# Patient Record
Sex: Female | Born: 1970 | Race: White | Hispanic: No | Marital: Married | Smoking: Never smoker
Health system: Southern US, Community
[De-identification: ages and names within clinical notes are randomized; demographics above are authoritative.]

## PROBLEM LIST (undated history)

## (undated) DIAGNOSIS — K222 Esophageal obstruction: Secondary | ICD-10-CM

## (undated) DIAGNOSIS — H9190 Unspecified hearing loss, unspecified ear: Secondary | ICD-10-CM

## (undated) DIAGNOSIS — E079 Disorder of thyroid, unspecified: Secondary | ICD-10-CM

## (undated) DIAGNOSIS — F419 Anxiety disorder, unspecified: Secondary | ICD-10-CM

## (undated) DIAGNOSIS — S62101A Fracture of unspecified carpal bone, right wrist, initial encounter for closed fracture: Secondary | ICD-10-CM

## (undated) DIAGNOSIS — K219 Gastro-esophageal reflux disease without esophagitis: Secondary | ICD-10-CM

## (undated) DIAGNOSIS — K317 Polyp of stomach and duodenum: Secondary | ICD-10-CM

## (undated) HISTORY — DX: Anxiety disorder, unspecified: F41.9

## (undated) HISTORY — PX: WRIST ARTHROSCOPY: SUR100

## (undated) HISTORY — DX: Fracture of unspecified carpal bone, right wrist, initial encounter for closed fracture: S62.101A

## (undated) HISTORY — DX: Polyp of stomach and duodenum: K31.7

## (undated) HISTORY — DX: Esophageal obstruction: K22.2

## (undated) HISTORY — PX: CERVIX LESION DESTRUCTION: SHX591

## (undated) HISTORY — DX: Unspecified hearing loss, unspecified ear: H91.90

## (undated) HISTORY — DX: Disorder of thyroid, unspecified: E07.9

## (undated) HISTORY — DX: Gastro-esophageal reflux disease without esophagitis: K21.9

---

## 1999-02-19 ENCOUNTER — Other Ambulatory Visit: Admission: RE | Admit: 1999-02-19 | Discharge: 1999-02-19 | Payer: Self-pay | Admitting: Gynecology

## 2000-03-11 ENCOUNTER — Other Ambulatory Visit: Admission: RE | Admit: 2000-03-11 | Discharge: 2000-03-11 | Payer: Self-pay | Admitting: Gynecology

## 2001-03-17 ENCOUNTER — Other Ambulatory Visit: Admission: RE | Admit: 2001-03-17 | Discharge: 2001-03-17 | Payer: Self-pay | Admitting: Gynecology

## 2002-03-20 ENCOUNTER — Other Ambulatory Visit: Admission: RE | Admit: 2002-03-20 | Discharge: 2002-03-20 | Payer: Self-pay | Admitting: Gynecology

## 2002-03-30 ENCOUNTER — Other Ambulatory Visit: Admission: RE | Admit: 2002-03-30 | Discharge: 2002-03-30 | Payer: Self-pay | Admitting: Gynecology

## 2002-06-21 ENCOUNTER — Other Ambulatory Visit: Admission: RE | Admit: 2002-06-21 | Discharge: 2002-06-21 | Payer: Self-pay | Admitting: Gynecology

## 2003-01-01 ENCOUNTER — Other Ambulatory Visit: Admission: RE | Admit: 2003-01-01 | Discharge: 2003-01-01 | Payer: Self-pay | Admitting: Gynecology

## 2003-08-16 ENCOUNTER — Other Ambulatory Visit: Admission: RE | Admit: 2003-08-16 | Discharge: 2003-08-16 | Payer: Self-pay | Admitting: Gynecology

## 2003-12-03 ENCOUNTER — Other Ambulatory Visit: Admission: RE | Admit: 2003-12-03 | Discharge: 2003-12-03 | Payer: Self-pay | Admitting: Gynecology

## 2004-05-19 ENCOUNTER — Other Ambulatory Visit: Admission: RE | Admit: 2004-05-19 | Discharge: 2004-05-19 | Payer: Self-pay | Admitting: Gynecology

## 2004-06-01 ENCOUNTER — Emergency Department (HOSPITAL_COMMUNITY): Admission: EM | Admit: 2004-06-01 | Discharge: 2004-06-01 | Payer: Self-pay | Admitting: *Deleted

## 2004-06-05 ENCOUNTER — Ambulatory Visit (HOSPITAL_BASED_OUTPATIENT_CLINIC_OR_DEPARTMENT_OTHER): Admission: RE | Admit: 2004-06-05 | Discharge: 2004-06-05 | Payer: Self-pay | Admitting: Orthopedic Surgery

## 2004-11-25 ENCOUNTER — Other Ambulatory Visit: Admission: RE | Admit: 2004-11-25 | Discharge: 2004-11-25 | Payer: Self-pay | Admitting: Gynecology

## 2005-05-13 ENCOUNTER — Other Ambulatory Visit: Admission: RE | Admit: 2005-05-13 | Discharge: 2005-05-13 | Payer: Self-pay | Admitting: Gynecology

## 2005-12-03 ENCOUNTER — Other Ambulatory Visit: Admission: RE | Admit: 2005-12-03 | Discharge: 2005-12-03 | Payer: Self-pay | Admitting: Gynecology

## 2006-02-11 IMAGING — CR DG WRIST COMPLETE 3+V*R*
4 series · 4 of 4 positions shown · non-contrast
Comparison: none

CLINICAL DATA: Fell rollerblading, pain.
 RIGHT WRIST COMPLETE:
 There is a comminuted fracture of the distal right radius present.   The fracture is impacted and associated with mild dorsal angulation of the distal shaft.   The fracture does extend into the radiocarpal joint.  No dislocation seen associated with this.

[view not recorded (1 of 4)]
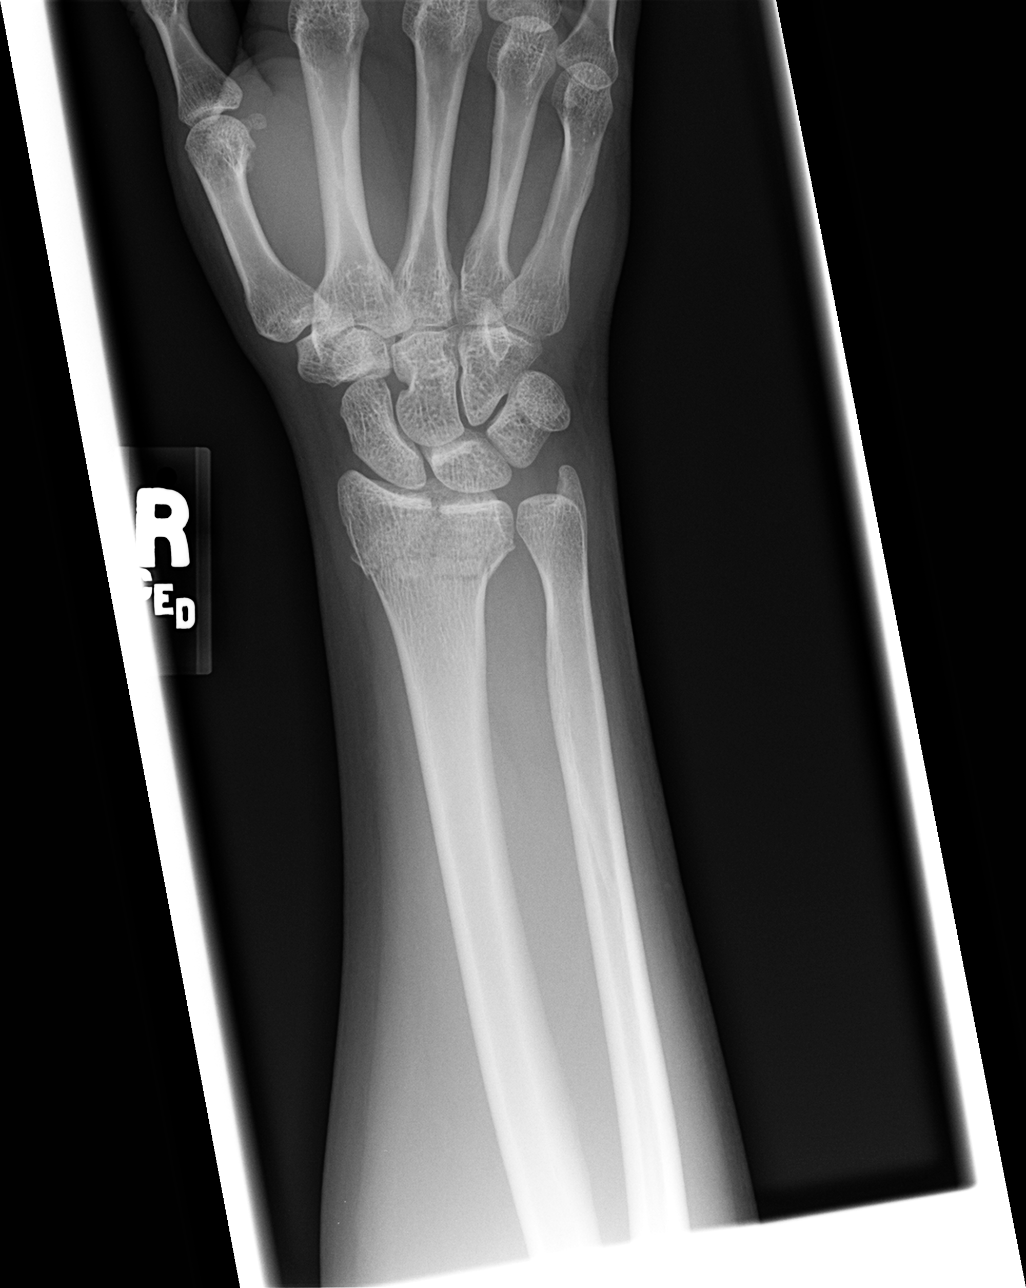

[view not recorded (2 of 4)]
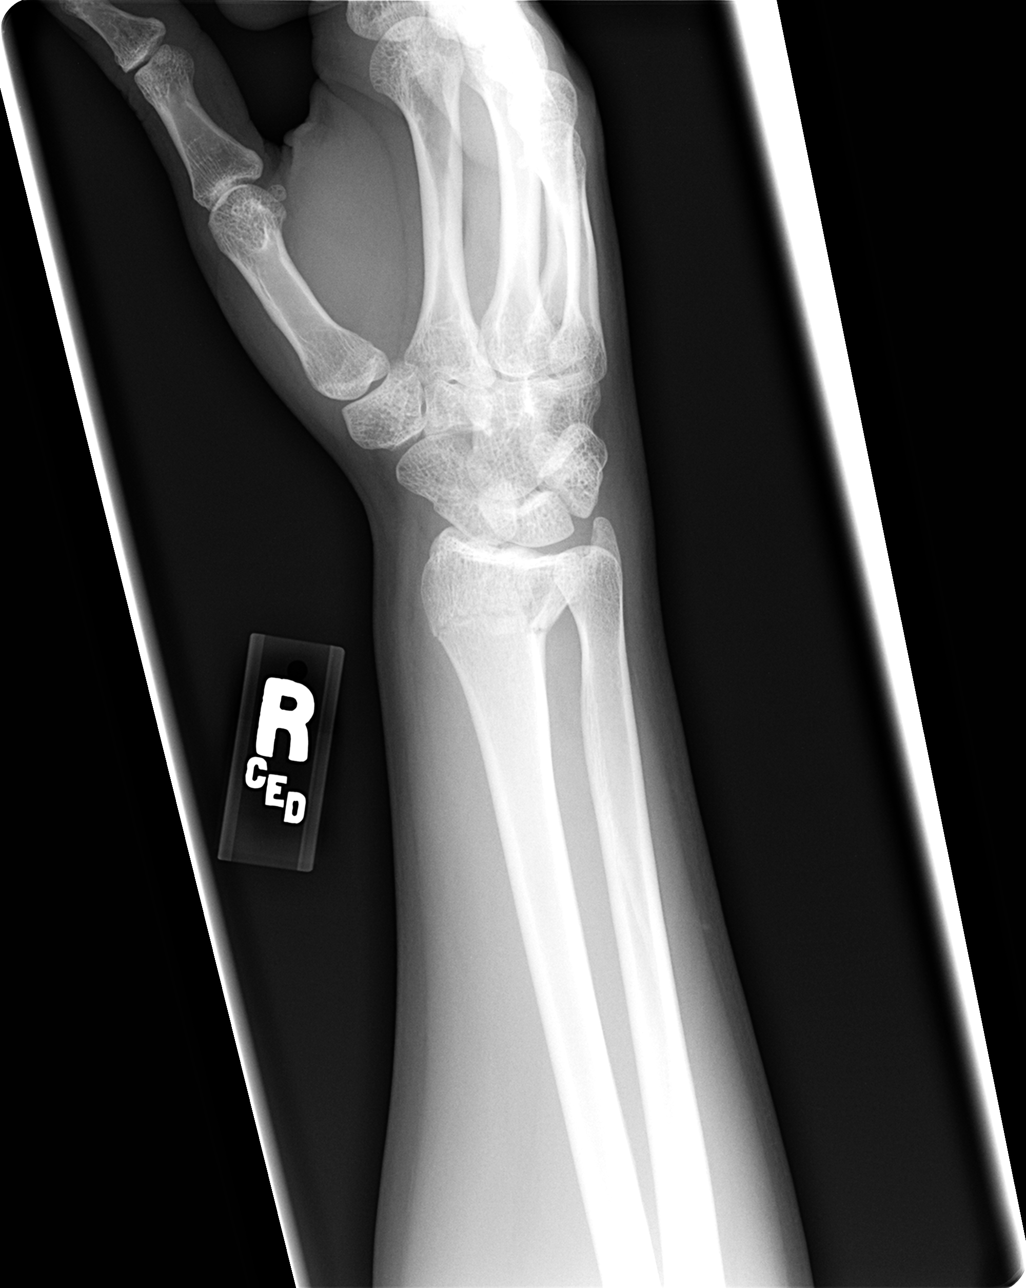

[view not recorded (3 of 4)]
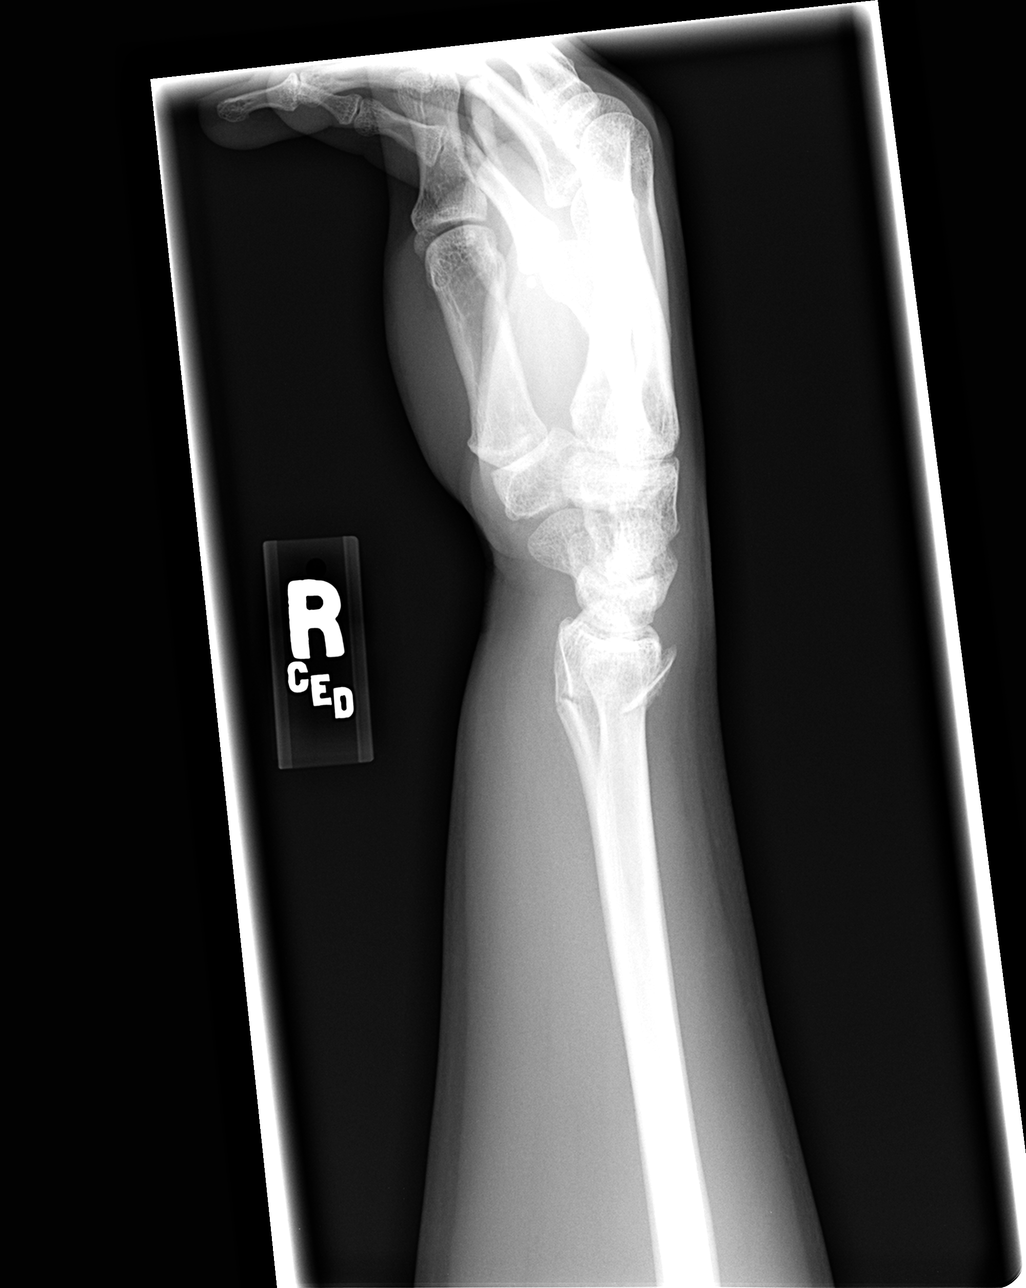

[view not recorded (4 of 4)]
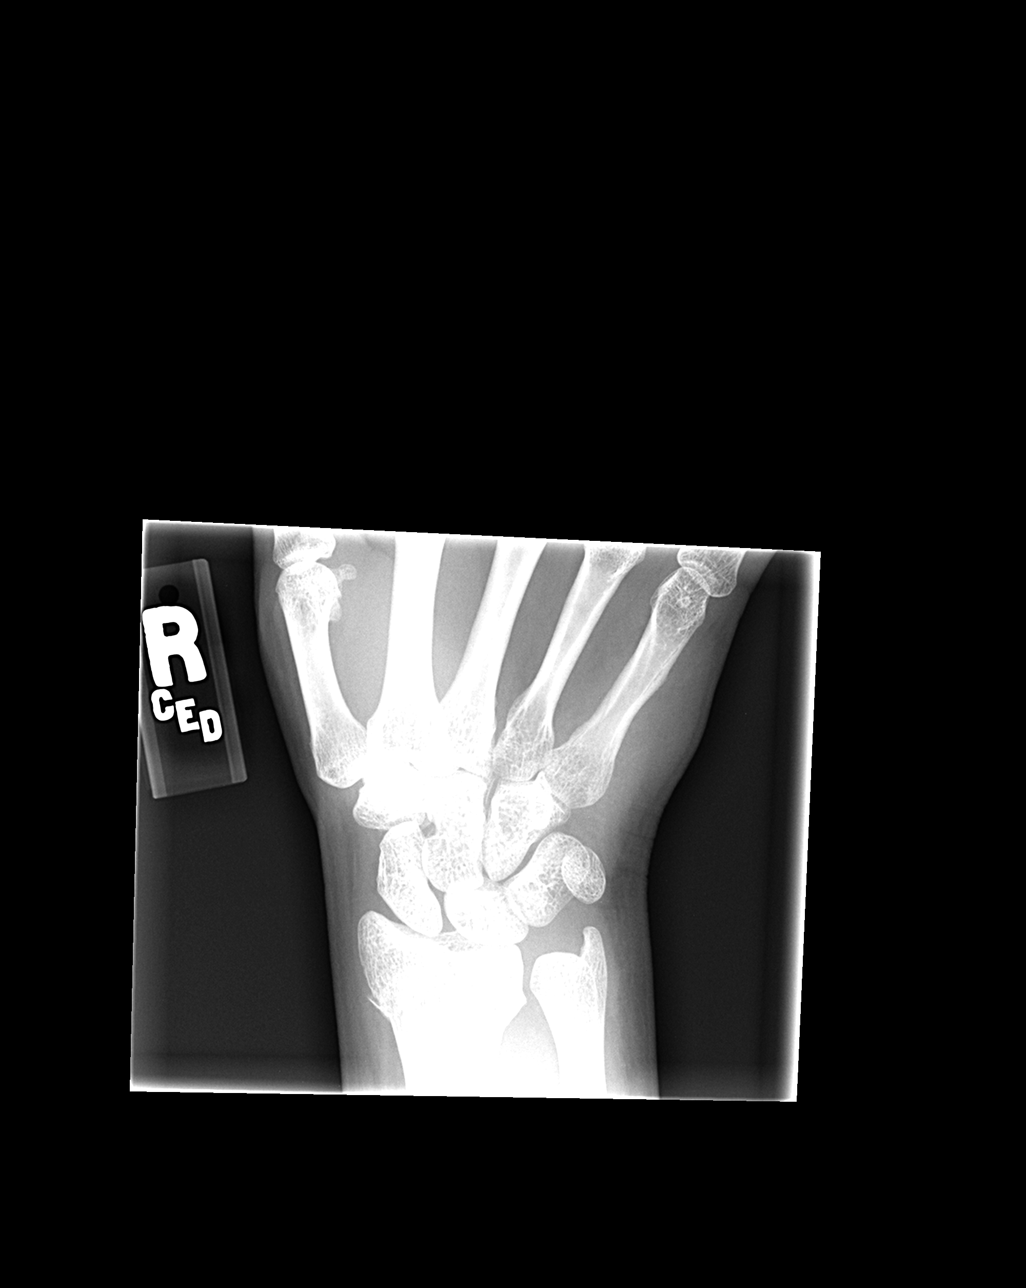

[4 of 4 positions shown; findings below may reference images not displayed]

IMPRESSION: Comminuted fracture distal right radius as discussed above.

## 2008-02-07 ENCOUNTER — Inpatient Hospital Stay (HOSPITAL_COMMUNITY): Admission: AD | Admit: 2008-02-07 | Discharge: 2008-02-07 | Payer: Self-pay | Admitting: Obstetrics and Gynecology

## 2008-02-08 ENCOUNTER — Inpatient Hospital Stay (HOSPITAL_COMMUNITY): Admission: AD | Admit: 2008-02-08 | Discharge: 2008-02-12 | Payer: Self-pay | Admitting: Obstetrics and Gynecology

## 2008-02-10 ENCOUNTER — Encounter (INDEPENDENT_AMBULATORY_CARE_PROVIDER_SITE_OTHER): Payer: Self-pay | Admitting: Obstetrics and Gynecology

## 2010-02-09 NOTE — L&D Delivery Note (Signed)
Delivery Note Pt progressed to complete dilation and pushed twice.  At 3:11 PM a healthy female was delivered via Vaginal, Spontaneous Delivery (Presentation: Left Occiput Anterior).  APGAR: 8, 9; weight 7 lb 5.6 oz (3334 g).   Placenta status: Intact, Spontaneous.   Anesthesia: Epidural  Episiotomy: None Lacerations: 1st degree Suture Repair: 3.0 vicryl Est. Blood Loss (mL): 350cc  Mom to postpartum.  Baby to nursery-stable.  Oliver Pila 10/16/2010, 3:30 PM

## 2010-04-04 LAB — ANTIBODY SCREEN: Antibody Screen: NEGATIVE

## 2010-04-04 LAB — VARICELLA ZOSTER ANTIBODY, IGG: Varicella: UNDETERMINED

## 2010-04-04 LAB — HEPATITIS B SURFACE ANTIGEN: Hepatitis B Surface Ag: NEGATIVE

## 2010-04-04 LAB — HIV ANTIBODY (ROUTINE TESTING W REFLEX): HIV: NONREACTIVE

## 2010-04-04 LAB — RUBELLA ANTIBODY, IGM: Rubella: UNDETERMINED

## 2010-05-26 LAB — CBC
MCHC: 34.1 g/dL (ref 30.0–36.0)
MCV: 90.5 fL (ref 78.0–100.0)
Platelets: 94 10*3/uL — ABNORMAL LOW (ref 150–400)
RBC: 3.8 MIL/uL — ABNORMAL LOW (ref 3.87–5.11)
RBC: 4.18 MIL/uL (ref 3.87–5.11)
WBC: 17.5 10*3/uL — ABNORMAL HIGH (ref 4.0–10.5)

## 2010-06-24 NOTE — H&P (Signed)
NAMEDEVANSHI, CALIFF NO.:  [id number]   MEDICAL RECORD NO.:  [id number]          PATIENT TYPE:  INP   LOCATION:  NA                            FACILITY:  [facility]   PHYSICIAN:  Sherron Monday, MD        DATE OF BIRTH:  [birth year]   DATE OF ADMISSION:  02/08/2008  DATE OF DISCHARGE:                              HISTORY & PHYSICAL   ADMISSION DIAGNOSES:  Intrauterine pregnancy at 40+ weeks, pregnancy-  induced hypertension, unfavorable cervix.   PROCEDURE PLANNED:  Induction of labor with Cervidil and Pitocin.   HISTORY OF PRESENT ILLNESS:  A 40 year old Caucasian female, G1, P0 at  40 plus 3, being admitted for induction of labor secondary to post term,  as well as pregnancy-induced hypertension, with an unfavorable cervix.  Her prenatal care has been largely uncomplicated, except that she is  advanced maternal age and having some issue with elevated LFT on routine  labs checked by her insurance company.  On the day of admission, she  states she has had good fetal movement, no loss of fluid, no vaginal  bleeding and occasional contractions.   PAST MEDICAL HISTORY:  Not significant.   PAST SURGICAL HISTORY:  A broken wrist.   PAST OBSTETRIC AND GYNECOLOGIC HISTORY:  G1 present pregnancy with an  [facility] of February 03, 2008, confirmed by LMP and early ultrasound.  She  has a history of an abnormal Pap smear with cryotherapy.  No sexually  transmitted diseases.   MEDICATIONS:  Prenatal vitamins.   ALLERGIES:  No known drug allergies.   SOCIAL HISTORY:  The patient denies alcohol, tobacco or drug use and is  married.   FAMILY HISTORY:  Significant for kidney and lung cancer in the maternal  grandmother.  Maternal grandfather with lung cancer.   PHYSICAL EXAMINATION:  VITAL SIGNS:  Afebrile.  Vital signs stable.  GENERAL:  In no apparent distress.  CARDIOVASCULAR:  Regular rate and rhythm.  LUNGS:  Clear to auscultation bilaterally.  ABDOMEN:  Soft, nontender,  nondistended.  Fundus is nontender.  EXTREMITIES:  Symmetric and nontender.  PELVIC:  Vaginal exam is 1 cm dilated, 70% effaced and -2 station and  vertex.   PRENATAL LABORATORIES:  Hemoglobin 14.0, platelets 238,000.  B positive.  Antibody screen negative.  Urinalysis negative.  Glucola 104.  Gonorrhea  negative, chlamydia negative.  RPR nonreactive.  Rubella equivocal.  Group B strep negative.  Cystic fibrosis screen negative.  Hepatitis B  surface antigen negative.  HIV negative.  AFP negative.  First trimester  screen within normal limits.  The day prior to her admission she was  evaluated for her pregnancy-induced hypertension and found to have  normal PIH labs.  Her blood pressures were closely monitored.  She had  an ultrasound at 11 and 5 weeks revealing a vanishing twin, normal  nuchal thickness.  Anatomy scan revealed normal anatomy, posterior  placenta and a female infant.  Group B strep negative.   IMPRESSION AND PLAN:  A 40 year old G1 for induction of labor with  Cervidil and Pitocin.  Discussed with the patient risks, benefits and  alternatives of induction of labor, given her elevated blood pressures.  She voiced an understanding to all this and wishes to proceed.  We will  admit her on the evening of February 08, 2008 and give her Cervidil on  that evening.  In the morning, will attempt to rupture her membranes and  give her Pitocin to augment her labor.  She voices understanding to this  plan.  We will also tale pregnancy induced hypertension labs at her  admission to evaluate where we are with that and monitor her blood  pressures closely.      Sherron Monday, MD  Electronically Signed     JB/MEDQ  D:  02/08/2008  T:  02/08/2008  Job:  [id number]

## 2010-06-24 NOTE — Discharge Summary (Signed)
[name], Dean NO.:  [id number]   MEDICAL RECORD NO.:  [id number]          PATIENT TYPE:  INP   LOCATION:  9132                          FACILITY:  [facility]   PHYSICIAN:  Huel Cote, M.D. DATE OF BIRTH:  14-Oct-1970   DATE OF ADMISSION:  02/08/2008  DATE OF DISCHARGE:  02/12/2008                               DISCHARGE SUMMARY   DISCHARGE DIAGNOSES:  1. Term pregnancy at 40 plus weeks, delivered.  2. Status post normal spontaneous vaginal delivery.  3. Intrapartum fever treated with antibiotics.  4. Mildly elevated blood pressures, postpartum.   DISCHARGE MEDICATIONS:  1. Motrin 600 mg p.o. every 6 hours.  2. Percocet 1-2 tablets p.o. every 4 hours p.r.n.   DISCHARGE FOLLOWUP:  The patient is to follow up in the office in 6  weeks with Dr. Ellyn Hack.   HOSPITAL COURSE:  The patient is a 40 year old G1, P0 admitted to 40  plus weeks' gestation for induction of labor.  Given postterm status  with some elevated blood pressures and negative preeclampsia workup.  Pregnancy then complicated by advanced maternal age and she had a  vanishing twin early on.  There were some elevated liver enzymes on her  lab exam, which normalized.  On admission, her blood pressures were  mildly elevated with diastolics in the 80s to 90s; however, all  preeclamptic labs were normal, and she was negative for protein in the  urine.   PAST MEDICAL HISTORY:  None.   PAST SURGICAL HISTORY:  Broken wrist.   PAST OBSTETRICAL HISTORY:  Current pregnancy only, was on initial twin  pregnancy with vanishing twin on the first trimester.   PAST GYN HISTORY:  None significant except for a history of cryotherapy  from an abnormal Pap smear, normal since that time.   MEDICATIONS:  Prenatal vitamins.   ALLERGIES:  None.   On admission, the patient's cervix was 1, 70, and -2 station.  Prenatal  labs are as follows, B positive, antibody negative, RPR nonreactive,  rubella equivocal,  group B strep negative, GC negative, Chlamydia  negative, hep B and surface antigen negative, HIV negative, and first  trimester screen normal.  She was admitted and given Cervidil and  Pitocin to ripen her.  The morning after admission, she was found to be  1+, 70, and -2 station, and had some contractions overnight.  She  progressed slowly throughout the day and late in the evening reached  active labor, received an epidural.  Before she delivered, she did spike  of significant temperature to 102.  Therefore, was placed on gentamicin  and ampicillin.  She eventually progressed to complete dilation, pushed  well, and had a normal spontaneous vaginal delivery of a vigorous female  infant, Apgars were 8 and 9, and weight was 7 pounds even.  Placenta  delivered intact.  Perineal laceration, second-degree in nature were  repaired with 3-0 Vicryl.  She remained on her antibiotics at delivery  and defervesced quickly thereafter.  She did have a small skin tag,  which was removed at the time of delivery.  She was admitted for routine  postpartum care.  On postpartum day #2, she was doing quite well except  for some cramping, her pain was well controlled, and she was ready for  discharge home.  She was discharged home with followup in the office in  6 weeks for her routine postpartum exam and given prescriptions for  Motrin and Percocet.      Huel Cote, M.D.  Electronically Signed     KR/MEDQ  D:  02/12/2008  T:  02/12/2008  Job:  [id number]

## 2010-06-27 NOTE — Op Note (Signed)
[name], [name]                ACCOUNT NO.:  [id number]   MEDICAL RECORD NO.:  [id number]          PATIENT TYPE:  AMB   LOCATION:  DSC                          FACILITY:  [facility]   PHYSICIAN:  Katy Fitch. Sypher Montez Hageman., M.D.DATE OF BIRTH:  Aug 05, 1970   DATE OF PROCEDURE:  06/05/2004  DATE OF DISCHARGE:                                 OPERATIVE REPORT   REFERRING PHYSICIAN:  Sheppard Penton. Stacie Acres, M.D.   PREOPERATIVE DIAGNOSIS:  Comminuted articular fracture right distal radius.   POSTOPERATIVE DIAGNOSIS:  Comminuted articular fracture right distal radius.   OPERATION:  Open reduction and internal fixation of right distal radial  comminuted articular fracture utilizing a 7 peg DVR plate system.   OPERATING SURGEON:  Katy Fitch. Sypher, M.D.   ASSISTANT:  Annye Rusk, PA-C.   ANESTHESIA:  general by LMA.   SUPERVISING ANESTHESIOLOGIST:  Quita Skye. Krista Blue, M.D.   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  While rollerblading on June 01, 2004, she fell onto  her outstretched right hand sustaining a comminuted articular fracture of  her right radius. She was seen in the emergency room by Evlyn Kanner, PA-C  where x-rays were obtained revealing a comminuted articular fracture of the  right radius.   She was placed in a well-padded sugar-tong splint and referred to our office  for follow-up on June 02, 2004.   At that time she was noted have minimal swelling. Her neurovascular exam was  intact. She had normal sensibility in the median, ulnar and radial  distribution and good capillary refill. Her films were studied. We  recommended proceeding with open reduction and internal fixation with  application of a DVR plate system.   She is brought to the operating room at this time.   PROCEDURE:  Stacey Dean is brought to the operating room, placed in supine  position on the operating table.   Following the induction of general  anesthesia by LMA technique, the right  arm was prepped with Betadine soap solution, sterilely draped. Pneumatic  tourniquet was applied proximal brachium.   Following exsanguination of the right arm with an Esmarch bandage, the  arterial tourniquet on the proximal brachium is inflated to 120 mmHg.   Procedure commenced with a standard DVR volar incision paralleling the  flexor carpi radialis tendon sheath. The fascia was released from the floor  of the flexor carpi radialis followed by retraction of the flexor pollicis  longus and ulnar direction and the radial artery in a radial direction.   Stacey Dean had a atypical brachial radialis muscle with both the muscle belly  and a palmar insertion of the tendon.   This tendon was elevated and the brachial radialis retracted radially.   The pronator quadratus was elevated and the fracture site identified.   The fracture was then molded with three-point molding directly on the volar  aspect of the cortex as well as with transcutaneous molding dorsally.   Anatomic reduction of the displaced articular fracture fragments was  achieved.   A 7-peg DVR plate was carefully placed in the  proper position to allow  anatomic reduction of her fracture.   A total of five smooth pegs and two threaded pegs were applied with a  threaded peg into the radial styloid and a threaded peg into the most ulnar  aspect of the lunate facette to control these fragments securely.   Care was taken to control pain and screw length with a C-arm fluoroscope as  well as careful measurements during drilling.   An anatomic reduction of the radius was achieved.   The plate was secured to the cortex with the standard cortical screws.   The wound was then carefully lavaged with sterile saline followed by repair  of the pronator quadratus over the plate with mattress suture of 0 Vicryl  followed by repair the skin with interrupted suture of 4-0 Vicryl and  intradermal  3-0 Prolene segmental sutures.   Stacey Dean was placed a compressive dressing with a sugar-tong splint  maintaining the forearm in 45 degrees of supination.   There were no apparent complications.. She tolerated surgery and anesthesia  well.   She will be discharged with prescriptions for Dilaudid 2 mg one to two  tablets p.o. q.4-6h. p.r.n. pain, 20 tablets without refill. Also Motrin 6  mg one p.o. q.6h. p.r.n. pain, 30 tablets of one refill and Keflex 500 mg  one p.o. q.8h. x4 days as prophylactic antibiotic.   She will return to our office for follow-up in one week or sooner if any  problems. She is encouraged to range her fingers, thumb and shoulder.      RVS/MEDQ  D:  06/05/2004  T:  06/05/2004  Job:  [id number]   cc:   Sheppard Penton. Stacie Acres, M.D.  [address]  [place] [address]  Fax: [phone]

## 2010-09-24 LAB — STREP B DNA PROBE: GBS: NEGATIVE

## 2010-10-09 ENCOUNTER — Telehealth (HOSPITAL_COMMUNITY): Payer: Self-pay | Admitting: *Deleted

## 2010-10-09 ENCOUNTER — Encounter (HOSPITAL_COMMUNITY): Payer: Self-pay | Admitting: *Deleted

## 2010-10-09 NOTE — Telephone Encounter (Signed)
Preadmission screen  

## 2010-10-10 ENCOUNTER — Encounter (HOSPITAL_COMMUNITY): Payer: Self-pay | Admitting: *Deleted

## 2010-10-10 ENCOUNTER — Telehealth (HOSPITAL_COMMUNITY): Payer: Self-pay | Admitting: *Deleted

## 2010-10-10 NOTE — Telephone Encounter (Signed)
Preadmission screen  

## 2010-10-16 ENCOUNTER — Encounter (HOSPITAL_COMMUNITY): Payer: Self-pay | Admitting: Anesthesiology

## 2010-10-16 ENCOUNTER — Inpatient Hospital Stay (HOSPITAL_COMMUNITY)
Admission: RE | Admit: 2010-10-16 | Discharge: 2010-10-17 | DRG: 775 | Disposition: A | Discharge status: Home / Self Care | Payer: 59 | Source: Ambulatory Visit | Attending: Obstetrics and Gynecology | Admitting: Obstetrics and Gynecology

## 2010-10-16 ENCOUNTER — Other Ambulatory Visit: Payer: Self-pay | Admitting: Obstetrics and Gynecology

## 2010-10-16 ENCOUNTER — Inpatient Hospital Stay (HOSPITAL_COMMUNITY): Payer: 59 | Admitting: Anesthesiology

## 2010-10-16 ENCOUNTER — Encounter (HOSPITAL_COMMUNITY): Payer: Self-pay

## 2010-10-16 DIAGNOSIS — O09529 Supervision of elderly multigravida, unspecified trimester: Secondary | ICD-10-CM | POA: Diagnosis present

## 2010-10-16 LAB — CBC
HCT: 41.7 % (ref 36.0–46.0)
MCHC: 33.6 g/dL (ref 30.0–36.0)
RDW: 13.1 % (ref 11.5–15.5)

## 2010-10-16 LAB — COMPREHENSIVE METABOLIC PANEL
ALT: 11 U/L (ref 0–35)
BUN: 11 mg/dL (ref 6–23)
Calcium: 10.2 mg/dL (ref 8.4–10.5)
GFR calc Af Amer: >60 mL/min (ref >60)
Glucose, Bld: 72 mg/dL (ref 70–99)
Sodium: 135 mEq/L (ref 135–145)
Total Protein: 6.5 g/dL (ref 6.0–8.3)

## 2010-10-16 LAB — URINALYSIS, DIPSTICK ONLY
Protein, ur: NEGATIVE mg/dL
Urobilinogen, UA: 0.2 mg/dL (ref 0.0–1.0)

## 2010-10-16 MED ORDER — SIMETHICONE 80 MG PO CHEW: 80.0000 mg | CHEWABLE_TABLET | ORAL | Status: DC | PRN

## 2010-10-16 MED ORDER — OXYCODONE-ACETAMINOPHEN 5-325 MG PO TABS
1.0000 | ORAL_TABLET | ORAL | Status: DC | PRN
  Administered 2010-10-16: 1 via ORAL
  Filled 2010-10-16: qty 1

## 2010-10-16 MED ORDER — IBUPROFEN 600 MG PO TABS
600.0000 mg | ORAL_TABLET | Freq: Four times a day (QID) | ORAL | Status: DC
  Administered 2010-10-16 – 2010-10-17 (×4): 600 mg via ORAL
  Filled 2010-10-16 (×4): qty 1

## 2010-10-16 MED ORDER — FLEET ENEMA 7-19 GM/118ML RE ENEM: 1.0000 | ENEMA | RECTAL | Status: DC | PRN

## 2010-10-16 MED ORDER — BENZOCAINE-MENTHOL 20-0.5 % EX AERO
1.0000 "application " | INHALATION_SPRAY | CUTANEOUS | Status: DC | PRN
  Administered 2010-10-16: 1 via TOPICAL

## 2010-10-16 MED ORDER — EPHEDRINE 5 MG/ML INJ
10.0000 mg | INTRAVENOUS | Status: DC | PRN
  Filled 2010-10-16 (×2): qty 4

## 2010-10-16 MED ORDER — LIDOCAINE HCL (PF) 1 % IJ SOLN
30.0000 mL | INTRAMUSCULAR | Status: DC | PRN
  Filled 2010-10-16 (×2): qty 30

## 2010-10-16 MED ORDER — PHENYLEPHRINE 40 MCG/ML (10ML) SYRINGE FOR IV PUSH (FOR BLOOD PRESSURE SUPPORT)
80.0000 ug | PREFILLED_SYRINGE | INTRAVENOUS | Status: DC | PRN
  Filled 2010-10-16 (×2): qty 5

## 2010-10-16 MED ORDER — SODIUM BICARBONATE 8.4 % IV SOLN
INTRAVENOUS | Status: DC | PRN
  Administered 2010-10-16: 5 mL via EPIDURAL

## 2010-10-16 MED ORDER — OXYTOCIN 20 UNITS IN LACTATED RINGERS INFUSION - SIMPLE
1.0000 m[IU]/min | INTRAVENOUS | Status: DC
  Administered 2010-10-16: 2 m[IU]/min via INTRAVENOUS
  Filled 2010-10-16: qty 1000

## 2010-10-16 MED ORDER — ACETAMINOPHEN 325 MG PO TABS: 650.0000 mg | ORAL_TABLET | ORAL | Status: DC | PRN

## 2010-10-16 MED ORDER — ZOLPIDEM TARTRATE 5 MG PO TABS: 5.0000 mg | ORAL_TABLET | Freq: Every evening | ORAL | Status: DC | PRN

## 2010-10-16 MED ORDER — SENNOSIDES-DOCUSATE SODIUM 8.6-50 MG PO TABS
2.0000 | ORAL_TABLET | Freq: Every day | ORAL | Status: DC
  Administered 2010-10-16: 2 via ORAL

## 2010-10-16 MED ORDER — DIBUCAINE 1 % RE OINT
1.0000 "application " | TOPICAL_OINTMENT | RECTAL | Status: DC | PRN
  Administered 2010-10-16: 1 via RECTAL
  Filled 2010-10-16: qty 28

## 2010-10-16 MED ORDER — LACTATED RINGERS IV SOLN
500.0000 mL | Freq: Once | INTRAVENOUS | Status: AC
  Administered 2010-10-16: 1000 mL via INTRAVENOUS

## 2010-10-16 MED ORDER — OXYTOCIN BOLUS FROM INFUSION
500.0000 mL | Freq: Once | INTRAVENOUS | Status: AC
  Administered 2010-10-16: 500 mL via INTRAVENOUS
  Filled 2010-10-16: qty 500

## 2010-10-16 MED ORDER — ONDANSETRON HCL 4 MG/2ML IJ SOLN: 4.0000 mg | INTRAMUSCULAR | Status: DC | PRN

## 2010-10-16 MED ORDER — ONDANSETRON HCL 4 MG PO TABS: 4.0000 mg | ORAL_TABLET | ORAL | Status: DC | PRN

## 2010-10-16 MED ORDER — LACTATED RINGERS IV SOLN: 500.0000 mL | INTRAVENOUS | Status: DC | PRN

## 2010-10-16 MED ORDER — DIPHENHYDRAMINE HCL 50 MG/ML IJ SOLN: 12.5000 mg | INTRAMUSCULAR | Status: DC | PRN

## 2010-10-16 MED ORDER — FENTANYL 2.5 MCG/ML BUPIVACAINE 1/10 % EPIDURAL INFUSION (WH - ANES)
14.0000 mL/h | INTRAMUSCULAR | Status: DC
  Administered 2010-10-16 (×2): 14 mL/h via EPIDURAL
  Filled 2010-10-16 (×2): qty 60

## 2010-10-16 MED ORDER — ONDANSETRON HCL 4 MG/2ML IJ SOLN: 4.0000 mg | Freq: Four times a day (QID) | INTRAMUSCULAR | Status: DC | PRN

## 2010-10-16 MED ORDER — BENZOCAINE-MENTHOL 20-0.5 % EX AERO
INHALATION_SPRAY | CUTANEOUS | Status: AC
  Administered 2010-10-16: 21:00:00
  Filled 2010-10-16: qty 56

## 2010-10-16 MED ORDER — OXYTOCIN 20 UNITS IN LACTATED RINGERS INFUSION - SIMPLE: 125.0000 mL/h | Freq: Once | INTRAVENOUS | Status: DC

## 2010-10-16 MED ORDER — DIPHENHYDRAMINE HCL 25 MG PO CAPS: 25.0000 mg | ORAL_CAPSULE | Freq: Four times a day (QID) | ORAL | Status: DC | PRN

## 2010-10-16 MED ORDER — OXYCODONE-ACETAMINOPHEN 5-325 MG PO TABS: 2.0000 | ORAL_TABLET | ORAL | Status: DC | PRN

## 2010-10-16 MED ORDER — CITRIC ACID-SODIUM CITRATE 334-500 MG/5ML PO SOLN: 30.0000 mL | ORAL | Status: DC | PRN

## 2010-10-16 MED ORDER — LACTATED RINGERS IV SOLN
INTRAVENOUS | Status: DC
  Administered 2010-10-16 (×2): 125 mL/h via INTRAVENOUS

## 2010-10-16 MED ORDER — WITCH HAZEL-GLYCERIN EX PADS
1.0000 "application " | MEDICATED_PAD | CUTANEOUS | Status: DC | PRN
  Administered 2010-10-16: 1 via TOPICAL

## 2010-10-16 MED ORDER — IBUPROFEN 600 MG PO TABS: 600.0000 mg | ORAL_TABLET | Freq: Four times a day (QID) | ORAL | Status: DC | PRN

## 2010-10-16 MED ORDER — LANOLIN HYDROUS EX OINT: TOPICAL_OINTMENT | CUTANEOUS | Status: DC | PRN

## 2010-10-16 MED ORDER — PHENYLEPHRINE 40 MCG/ML (10ML) SYRINGE FOR IV PUSH (FOR BLOOD PRESSURE SUPPORT)
80.0000 ug | PREFILLED_SYRINGE | INTRAVENOUS | Status: DC | PRN
  Filled 2010-10-16: qty 5

## 2010-10-16 MED ORDER — TETANUS-DIPHTH-ACELL PERTUSSIS 5-2.5-18.5 LF-MCG/0.5 IM SUSP
0.5000 mL | Freq: Once | INTRAMUSCULAR | Status: AC
  Administered 2010-10-17: 0.5 mL via INTRAMUSCULAR
  Filled 2010-10-16: qty 0.5

## 2010-10-16 MED ORDER — TERBUTALINE SULFATE 1 MG/ML IJ SOLN: 0.2500 mg | Freq: Once | INTRAMUSCULAR | Status: DC | PRN

## 2010-10-16 MED ORDER — EPHEDRINE 5 MG/ML INJ
10.0000 mg | INTRAVENOUS | Status: DC | PRN
  Filled 2010-10-16: qty 4

## 2010-10-16 MED ORDER — PRENATAL PLUS 27-1 MG PO TABS
1.0000 | ORAL_TABLET | Freq: Every day | ORAL | Status: DC
  Filled 2010-10-16: qty 1

## 2010-10-16 NOTE — Progress Notes (Signed)
Stacey Dean is a 40 y.o. G2P1001 at [redacted]w[redacted]d   Subjective: Pt feels well s/p epidural.  Objective: BP 133/74  Pulse 95  Temp(Src) 97.4 F (36.3 C) (Oral)  Resp 15  Ht 5\' 7"  (1.702 m)  Wt 86.637 kg (191 lb)  BMI 29.91 kg/m2  SpO2 97%      FHT:  FHR: 140 bpm, variability: moderate,  accelerations:  Present,  decelerations:  Absent UC:   regular, every 3 minutes SVE:   80/5-6/-1  Labs: Lab Results  Component Value Date   WBC 10.7* 10/16/2010   HGB 14.0 10/16/2010   HCT 41.7 10/16/2010   MCV 87.6 10/16/2010   PLT 168 10/16/2010    Assessment / Plan: Induction of labor due to favorable cervix,  progressing well on pitocin  Labor: Progressing normally Preeclampsia:  labs WNL, BP WNL with epidural Fetal Wellbeing:  Category I Pain Control:  Epidural Carolene Gitto W 10/16/2010, 12:59 PM

## 2010-10-16 NOTE — Anesthesia Procedure Notes (Signed)
Epidural Patient location during procedure: OB  Staffing Anesthesiologist: Nicolaus Andel EDWARD  Preanesthetic Checklist Completed: patient identified, site marked, surgical consent, pre-op evaluation, timeout performed, IV checked, risks and benefits discussed and monitors and equipment checked  Epidural Patient position: sitting Prep: site prepped and draped and DuraPrep Patient monitoring: continuous pulse ox and blood pressure Approach: midline Injection technique: LOR air  Needle:  Needle type: Tuohy  Needle gauge: 17 G Needle length: 9 cm Needle insertion depth: 5 cm cm Catheter type: closed end flexible Catheter size: 19 Gauge Catheter at skin depth: 10 cm Test dose: negative  Assessment Events: blood not aspirated, injection not painful, no injection resistance, negative IV test and no paresthesia  Additional Notes Dosing of Epidural: 1st dose, through needle...... ( mg expressed as equavilent  cc's medication  from .1%Bupiv / fentanyl syringe from L&D pump)...............  5mg Marcaine  2nd dose, through catheter after waiting 3 minutes.... epi 1:200K + Xylocaine 40 mg 3rd dose, through catheter, after waiting 3 minutes.....epi 1:200K + Xylocaine 60 mg ( 2% Xylo charted as a single dose in Epic Meds for ease of charting; actual dosing was fractionated as above, for saftey's sake)  As each dose occurred, patient was free of IV sx; and patient exhibited no evidence of SA injection.  Patient is more comfortable after epidural dosed. Please see RN's note for documentation of vital signs,and FHR which are stable.    

## 2010-10-16 NOTE — H&P (Signed)
Stacey Dean is a 40 y.o. female G2P1001 at 63 2/7 weeks (EDD 10/21/10 by 9 week [place]) presenting for induction of labor given term status and favorable cervix with some mildly elevated BP's to 130/90's over the last several prenatal visits.  No proteinuria, no PIH sx No other prenatal issues.  History OB History    Grav Para Term Preterm Abortions TAB SAB Ect Mult Living   2 1 1       1     NSVD 2010 7lbs  Past Medical History  Diagnosis Date  . Wrist fracture, right     hx   Past Surgical History  Procedure Date  . Cervix lesion destruction         (Cryo)  Family History: family history includes Cancer in her maternal grandfather and maternal grandmother; Heart disease in her father; and Stroke in her paternal grandfather. Social History:  reports that she has never smoked. She does not have any smokeless tobacco history on file. She reports that she does not drink alcohol or use illicit drugs.  ROS  Dilation: 2 Effacement (%): 50 Station: -2 Exam by:: Dr Senaida Ores AROM clear  Blood pressure 146/91, pulse 92, temperature 97.9 F (36.6 C), temperature source Oral, resp. rate 19, height 5\' 7"  (1.702 m), weight 86.637 kg (191 lb). Maternal Exam:  Abdomen: Patient reports no abdominal tenderness. Estimated fetal weight is 7.5 lbs.   Fetal presentation: vertex  Introitus: Normal vulva. Normal vagina.  Amniotic fluid character: clear.  Cervix: Cervix evaluated by digital exam.     Physical Exam  Constitutional: She appears well-developed.  Cardiovascular: Normal rate and regular rhythm.   Respiratory: Effort normal and breath sounds normal.  GI: Soft. Bowel sounds are normal.  Neurological: She is alert.  Psychiatric: Her behavior is normal.    Prenatal labs: ABO, Rh: B/Positive/-- (02/24 0000) Antibody: Negative (02/24 0000) Rubella:  Equivocal RPR: Nonreactive (02/24 0000)  HBsAg: Negative (02/24 0000)  HIV: Non-reactive (02/24 0000)  GBS: Negative (08/15  0000)  First trimester screen negative AFP negative One hour glucola 107 Assessment/Plan: Plan induction with  Pitocin.  Pt still with no PIH sx.  Will check PIH labs and urine for protein. Pt plans epidural when active.  Oliver Pila 10/16/2010, 8:36 AM

## 2010-10-16 NOTE — Anesthesia Postprocedure Evaluation (Signed)
  Anesthesia Post-op Note  Patient: Stacey Dean  Procedure(s) Performed: * No procedures listed *  Patient Location: Mother/Baby  Anesthesia Type: Epidural  Level of Consciousness: awake, alert  and oriented  Airway and Oxygen Therapy: Patient Spontanous Breathing and Patient connected to nasal cannula oxygen  Post-op Pain: none  Post-op Assessment: Post-op Vital signs reviewed and Patient's Cardiovascular Status Stable  Post-op Vital Signs: Reviewed and stable  Complications: No apparent anesthesia complications

## 2010-10-16 NOTE — Anesthesia Postprocedure Evaluation (Deleted)
  Anesthesia Post-op Note  Patient: Stacey Dean  Procedure(s) Performed: * No procedures listed *  Patient Location: PACU  Anesthesia Type: General  Level of Consciousness: awake, alert  and oriented  Airway and Oxygen Therapy: Patient Spontanous Breathing  Post-op Pain: none  Post-op Assessment: Post-op Vital signs reviewed  Post-op Vital Signs: Reviewed and stable  Complications: No apparent anesthesia complications

## 2010-10-16 NOTE — Anesthesia Preprocedure Evaluation (Signed)

## 2010-10-17 LAB — CBC
MCH: 29.1 pg (ref 26.0–34.0)
MCHC: 33.1 g/dL (ref 30.0–36.0)
MCV: 87.8 fL (ref 78.0–100.0)
Platelets: 121 10*3/uL — ABNORMAL LOW (ref 150–400)
RDW: 13.2 % (ref 11.5–15.5)

## 2010-10-17 MED ORDER — IBUPROFEN 600 MG PO TABS: 600.0000 mg | ORAL_TABLET | Freq: Four times a day (QID) | ORAL | Status: AC

## 2010-10-17 MED ORDER — MEASLES, MUMPS & RUBELLA VAC ~~LOC~~ INJ
0.5000 mL | INJECTION | Freq: Once | SUBCUTANEOUS | Status: AC
  Administered 2010-10-17: 0.5 mL via SUBCUTANEOUS
  Filled 2010-10-17: qty 0.5

## 2010-10-17 NOTE — Progress Notes (Signed)
Post Partum Day 1 Subjective: no complaints, up ad lib and tolerating PO  Objective: Blood pressure 118/80, pulse 60, temperature 98.1 F (36.7 C), temperature source Oral, resp. rate 18, height 5\' 7"  (1.702 m), weight 86.637 kg (191 lb), SpO2 94.00%, unknown if currently breastfeeding.  Physical Exam:  General: alert Lochia: appropriate Uterine Fundus: firm  Basename 10/17/10 0521 10/16/10 0750  HGB 11.6* 14.0  HCT 34.7* 41.7    Assessment/Plan: Discharge home--pt requests early d/c Motrin, has at home F/u 6 weeks   LOS: 1 day   Harlan Ervine W 10/17/2010, 8:59 AM

## 2010-10-17 NOTE — Discharge Summary (Signed)
Obstetric Discharge Summary Reason for Admission: induction of labor Prenatal Procedures: none Intrapartum Procedures: spontaneous vaginal delivery Postpartum Procedures: none Complications-Operative and Postpartum: first degree perineal laceration Hemoglobin  Date Value Range Status  10/17/2010 11.6* 12.0-15.0 (g/dL) Final     DELTA CHECK NOTED     REPEATED TO VERIFY     HCT  Date Value Range Status  10/17/2010 34.7* 36.0-46.0 (%) Final    Discharge Diagnoses: Term Pregnancy-delivered  Discharge Information: Date: 10/17/2010 Activity: pelvic rest Diet: routine Medications: Ibuprophen Condition: improved Instructions: refer to practice specific booklet Discharge to: home Follow-up Information    Follow up with Stacey Dean in 6 weeks.   Contact information:   [address] [place] [address] [phone]          Newborn Data: Live born female  Birth Weight: 7 lb 5.6 oz (3334 g) APGAR: 8, 9  Home with mother.  Stacey Dean 10/17/2010, 9:03 AM

## 2010-11-14 LAB — COMPREHENSIVE METABOLIC PANEL
Albumin: 3 g/dL — ABNORMAL LOW (ref 3.5–5.2)
Alkaline Phosphatase: 225 U/L — ABNORMAL HIGH (ref 39–117)
Alkaline Phosphatase: 237 U/L — ABNORMAL HIGH (ref 39–117)
BUN: 11 mg/dL (ref 6–23)
BUN: 8 mg/dL (ref 6–23)
Chloride: 103 mEq/L (ref 96–112)
Creatinine, Ser: 0.84 mg/dL (ref 0.4–1.2)
GFR calc Af Amer: >60 mL/min (ref >60)
Glucose, Bld: 104 mg/dL — ABNORMAL HIGH (ref 70–99)
Potassium: 3.8 mEq/L (ref 3.5–5.1)
Potassium: 4.1 mEq/L (ref 3.5–5.1)
Total Bilirubin: 0.2 mg/dL — ABNORMAL LOW (ref 0.3–1.2)
Total Protein: 6.4 g/dL (ref 6.0–8.3)

## 2010-11-14 LAB — CBC
HCT: 36.7 % (ref 36.0–46.0)
HCT: 40.8 % (ref 36.0–46.0)
MCV: 89.1 fL (ref 78.0–100.0)
Platelets: 109 10*3/uL — ABNORMAL LOW (ref 150–400)
Platelets: 127 10*3/uL — ABNORMAL LOW (ref 150–400)
RBC: 4.12 MIL/uL (ref 3.87–5.11)
RDW: 13.7 % (ref 11.5–15.5)
RDW: 13.8 % (ref 11.5–15.5)
WBC: 8.8 10*3/uL (ref 4.0–10.5)

## 2010-11-14 LAB — LACTATE DEHYDROGENASE: LDH: 151 U/L (ref 94–250)

## 2010-11-14 LAB — URIC ACID: Uric Acid, Serum: 6.3 mg/dL (ref 2.4–7.0)

## 2013-02-09 DIAGNOSIS — E079 Disorder of thyroid, unspecified: Secondary | ICD-10-CM

## 2013-02-09 HISTORY — DX: Disorder of thyroid, unspecified: E07.9

## 2013-12-11 ENCOUNTER — Encounter (HOSPITAL_COMMUNITY): Payer: Self-pay

## 2014-06-20 ENCOUNTER — Telehealth: Payer: Self-pay | Admitting: Internal Medicine

## 2014-06-21 NOTE — Telephone Encounter (Signed)
Pt having problems swallowing, states it is quite painful and burns. Requesting to be seen. Pt scheduled to see Willette ClusterPaula Guenther NP 06/28/14@1 :30pm. Pt aware of appt.

## 2014-06-28 ENCOUNTER — Encounter: Payer: Self-pay | Admitting: Nurse Practitioner

## 2014-06-28 ENCOUNTER — Ambulatory Visit (INDEPENDENT_AMBULATORY_CARE_PROVIDER_SITE_OTHER): Payer: Managed Care, Other (non HMO) | Admitting: Nurse Practitioner

## 2014-06-28 VITALS — BP 140/90 | HR 80 | Ht 67.0 in | Wt 180.6 lb

## 2014-06-28 DIAGNOSIS — R1310 Dysphagia, unspecified: Secondary | ICD-10-CM

## 2014-06-28 DIAGNOSIS — K219 Gastro-esophageal reflux disease without esophagitis: Secondary | ICD-10-CM

## 2014-06-28 NOTE — Patient Instructions (Signed)

## 2014-06-29 ENCOUNTER — Encounter: Payer: Self-pay | Admitting: Nurse Practitioner

## 2014-06-29 NOTE — Progress Notes (Signed)
    HPI :  Patient is a 44 year old female referred by PCP for evaluation of dysphagia. She has a long-standing history of GERD. Patient used to treat her heartburn with Tums. 6 months ago she was started on a daily PPI and GERD symptoms improved but did not resolve. She went off PPI in April (no refills) sees. Last week she had significant solid food dysphasia. After restarting PPI last Thursday her swallowing problems have resolved. It hurts her chest when food becomes lodged but other than that no pain with swallowing. Patient has not had a recent antibiotics. Her weight is stable, actually up.  Past Medical History  Diagnosis Date  . Wrist fracture, right     hx  . Hearing loss   . GERD (gastroesophageal reflux disease)     Family History  Problem Relation Age of Onset  . Heart disease Father   . Cancer Maternal Grandmother   . Cancer Maternal Grandfather   . Stroke Paternal Grandfather   . Esophageal cancer Paternal Uncle    History  Substance Use Topics  . Smoking status: Never Smoker   . Smokeless tobacco: Never Used  . Alcohol Use: No   Current Outpatient Prescriptions  Medication Sig Dispense Refill  . fluticasone (FLONASE) 50 MCG/ACT nasal spray   3  . JUNEL FE 1/20 1-20 MG-MCG tablet Take 1 tablet by mouth daily.  2  . omeprazole (PRILOSEC) 40 MG capsule   5   No current facility-administered medications for this visit.   No Known Allergies   Review of Systems: All systems reviewed and negative except where noted in HPI.    Physical Exam: BP 140/90 mmHg  Pulse 80  Ht 5\' 7"  (1.702 m)  Wt 180 lb 9.6 oz (81.92 kg)  BMI 28.28 kg/m2  LMP 06/12/2014 (Approximate) Constitutional: Pleasant,well-developed, white female in no acute distress. HEENT: Normocephalic and atraumatic. Conjunctivae are normal. No scleral icterus. Neck supple.  Cardiovascular: Normal rate, regular rhythm.  Pulmonary/chest: Effort normal and breath sounds normal. No wheezing, rales or  rhonchi. Abdominal: Soft, nondistended, nontender. Bowel sounds active throughout. There are no masses palpable. No hepatomegaly. Extremities: no edema Lymphadenopathy: No cervical adenopathy noted. Neurological: Alert and oriented to person place and time. Skin: Skin is warm and dry. No rashes noted. Psychiatric: Normal mood and affect. Behavior is normal.   ASSESSMENT AND PLAN:  701. 44 year old female with long-standing GERD, significantly improved after starting PPI  6 months ago.   2. One year history of intermittent solid food dysphagia,  better but not resolved on PPI. Recently with exacerbation of dysphagia after being off PPI in April. Symptoms improved again after restarting PPI last week. We need to rule out esophageal stricture, especially since patient continues to have occasional solid food dysphagia on PPI . Will schedule EGD with possible dilation. The benefits, risks, and potential complications of EGD with possible biopsies and/or dilation were discussed with the patient and she agrees to proceed.      CC: Tracey Harriesavid Bouska, MD

## 2014-07-13 NOTE — Progress Notes (Signed)
Agree with Ms. Guenther's assessment and plan. Danai Gotto E. Griffin Gerrard, MD, FACG   

## 2014-07-20 ENCOUNTER — Other Ambulatory Visit: Payer: Self-pay | Admitting: Otolaryngology

## 2014-07-20 DIAGNOSIS — E041 Nontoxic single thyroid nodule: Secondary | ICD-10-CM

## 2014-08-22 ENCOUNTER — Other Ambulatory Visit (HOSPITAL_COMMUNITY)
Admission: RE | Admit: 2014-08-22 | Discharge: 2014-08-22 | Disposition: A | Discharge status: Home / Self Care | Payer: Managed Care, Other (non HMO) | Source: Ambulatory Visit | Attending: Interventional Radiology | Admitting: Interventional Radiology

## 2014-08-22 ENCOUNTER — Ambulatory Visit
Admission: RE | Admit: 2014-08-22 | Discharge: 2014-08-22 | Disposition: A | Discharge status: Home / Self Care | Payer: Managed Care, Other (non HMO) | Source: Ambulatory Visit | Attending: Otolaryngology | Admitting: Otolaryngology

## 2014-08-22 DIAGNOSIS — E041 Nontoxic single thyroid nodule: Secondary | ICD-10-CM | POA: Insufficient documentation

## 2014-08-24 ENCOUNTER — Encounter: Payer: Managed Care, Other (non HMO) | Admitting: Internal Medicine

## 2014-08-27 ENCOUNTER — Encounter: Payer: Self-pay | Admitting: *Deleted

## 2014-08-27 NOTE — Telephone Encounter (Signed)
Created in error

## 2014-08-31 ENCOUNTER — Telehealth: Payer: Self-pay | Admitting: Internal Medicine

## 2014-08-31 ENCOUNTER — Encounter: Payer: Managed Care, Other (non HMO) | Admitting: Internal Medicine

## 2014-08-31 NOTE — Telephone Encounter (Signed)
Ok no charge

## 2016-05-03 IMAGING — US US THYROID BIOPSY
1 series · 9 of 9 positions shown · non-contrast
Comparison: None.

CLINICAL DATA: Dominant left thyroid nodule

EXAM:
ULTRASOUND GUIDED NEEDLE ASPIRATE BIOPSY OF THE THYROID GLAND

[Series 1: us thyroid biopsy · 0.07mm/px · 9 of 9 slices shown]
[im 1/9]
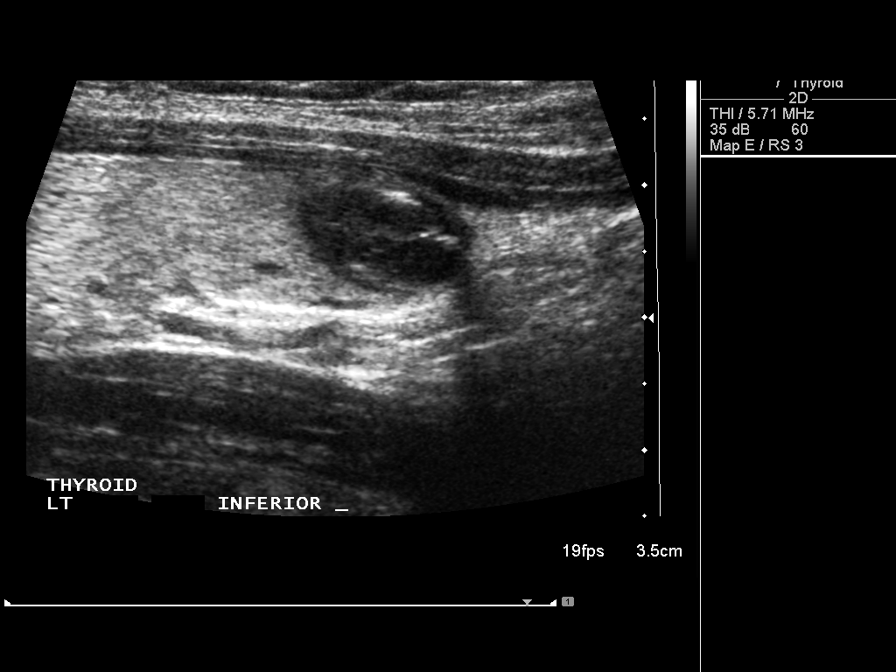
[im 2/9]
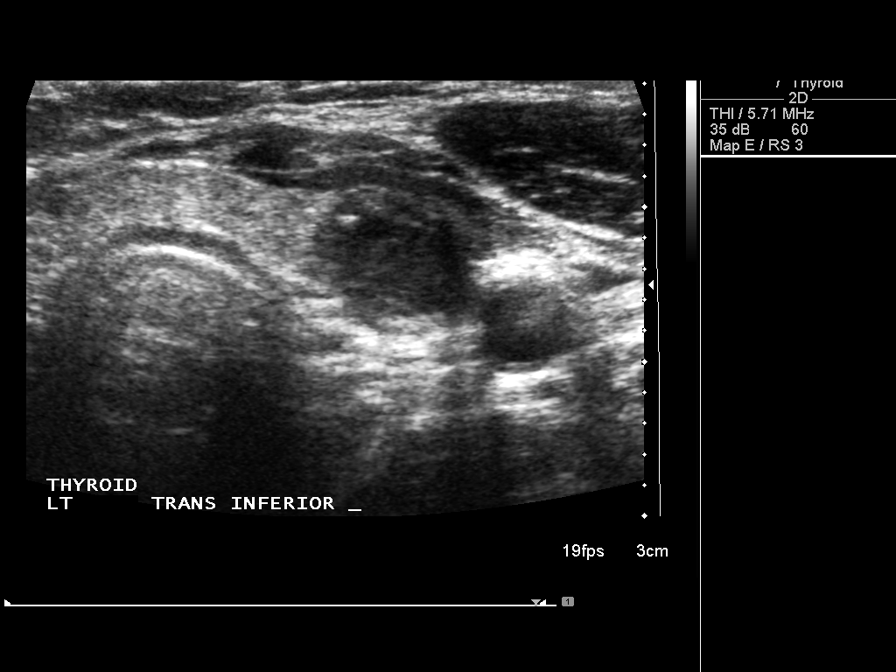
[im 3/9]
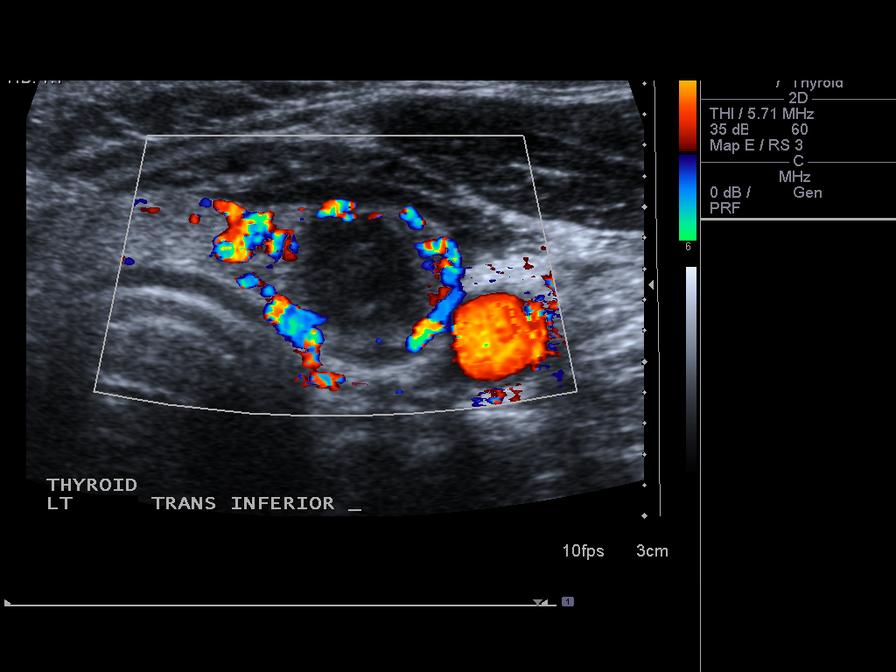
[im 4/9]
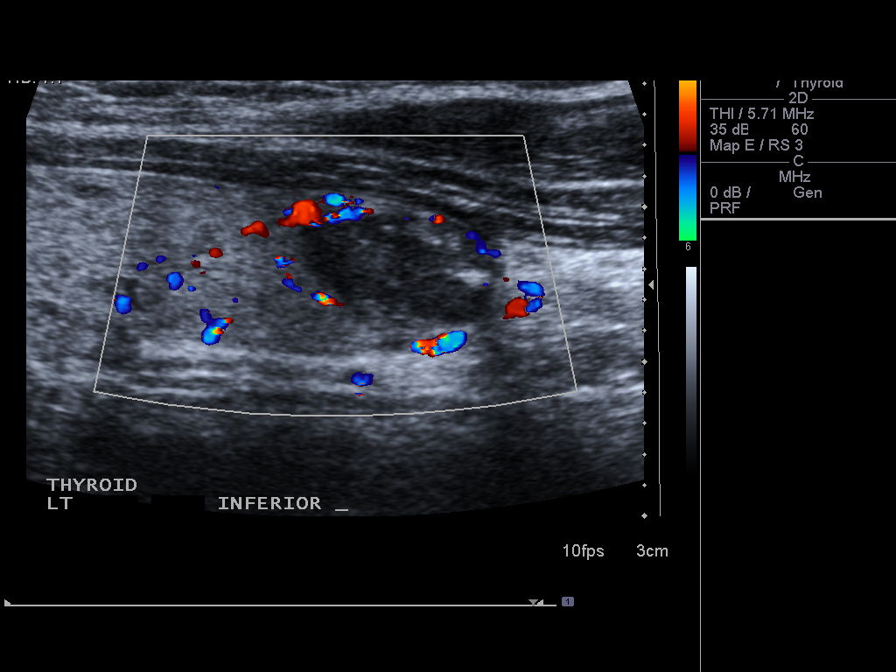
[im 5/9]
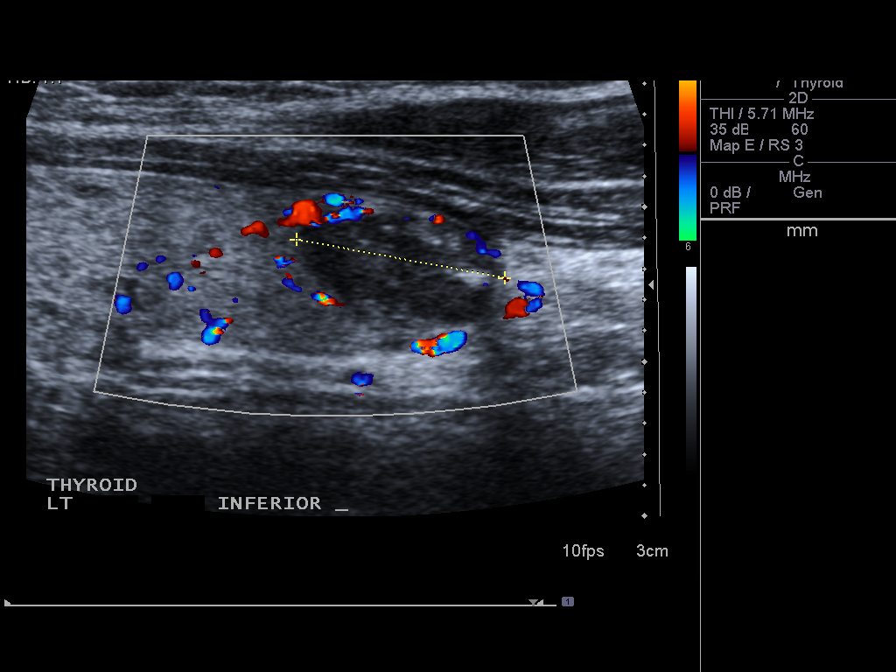
[im 6/9]
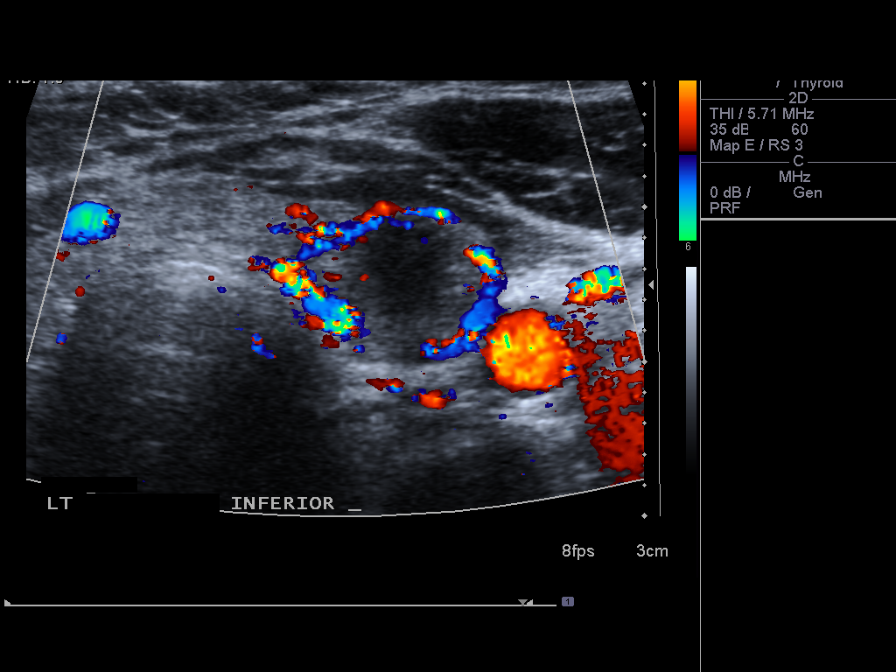
[im 7/9]
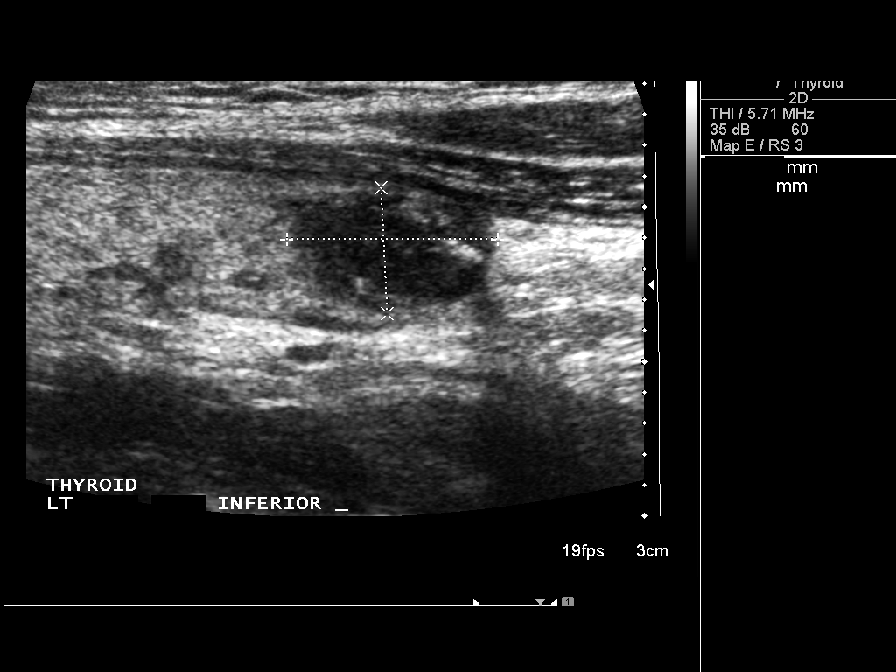
[im 8/9]
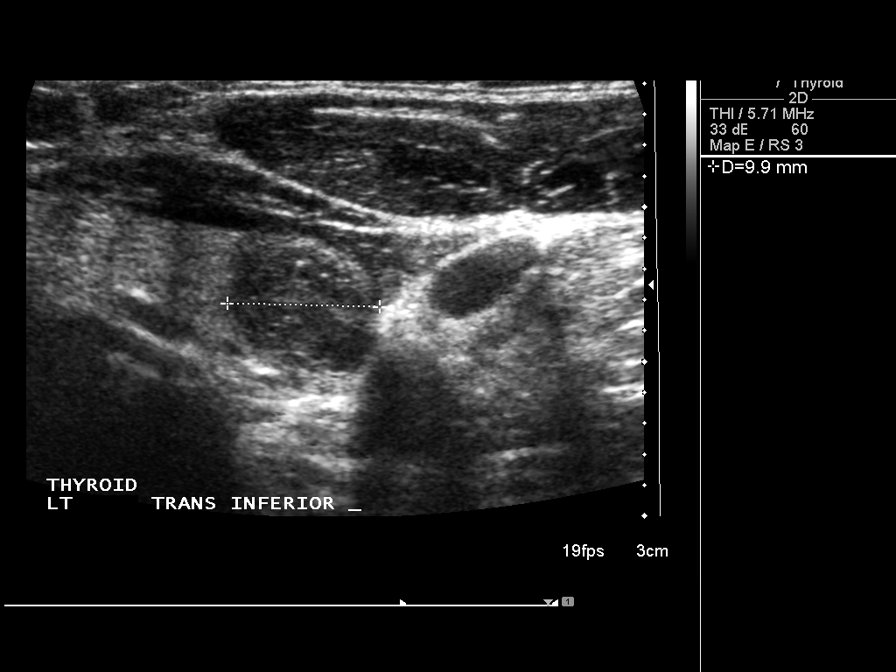
[im 9/9]
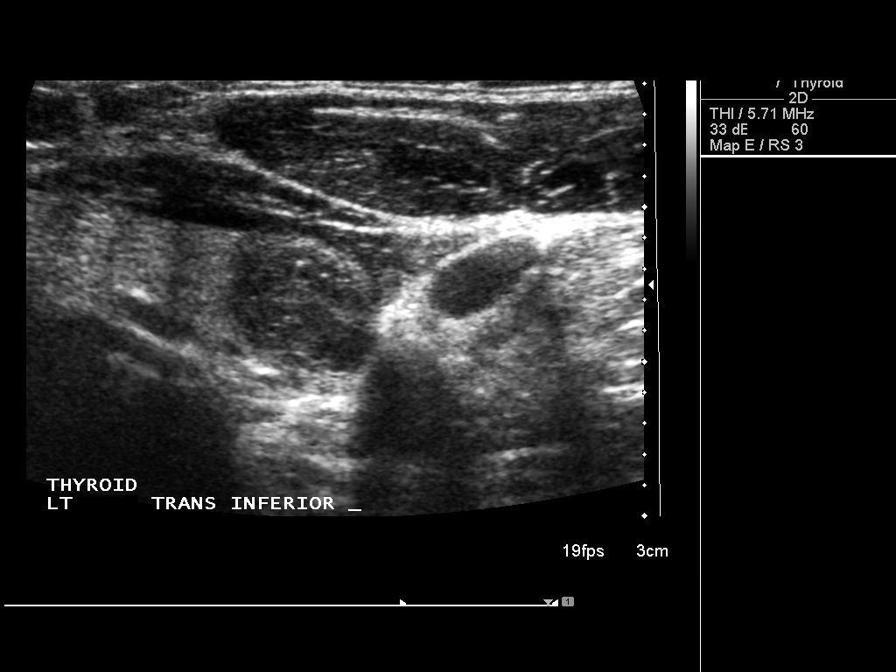

[9 of 9 positions shown; findings below may reference images not displayed]

PROCEDURE:
Thyroid biopsy was thoroughly discussed with the patient and
questions were answered. The benefits, risks, alternatives, and
complications were also discussed. The patient understands and
wishes to proceed with the procedure. Written consent was obtained.

The complex left-sided thyroid nodule has markedly reduced in size
and now measures 1.4 cm in maximal diameter. It is however
completely solid now in the fluid component has resolved. Biopsy
will be performed.



COMPLICATIONS:
None.
FINDINGS: Images document that the large left-sided complex nodule is now a
solid 1.4 cm nodule. Subsequent images demonstrate needle placement
in the left thyroid nodule.
IMPRESSION: Ultrasound guided needle aspirate biopsy performed of the left
thyroid nodule.

## 2016-05-03 IMAGING — US US THYROID BIOPSY
1 series · 10 of 10 positions shown · non-contrast
Comparison: None.

CLINICAL DATA: Dominant left thyroid nodule

EXAM:
ULTRASOUND GUIDED NEEDLE ASPIRATE BIOPSY OF THE THYROID GLAND

[Series 1: us thyroid biopsy · 10 acquisitions, 10 frames shown]
[im 1/10]
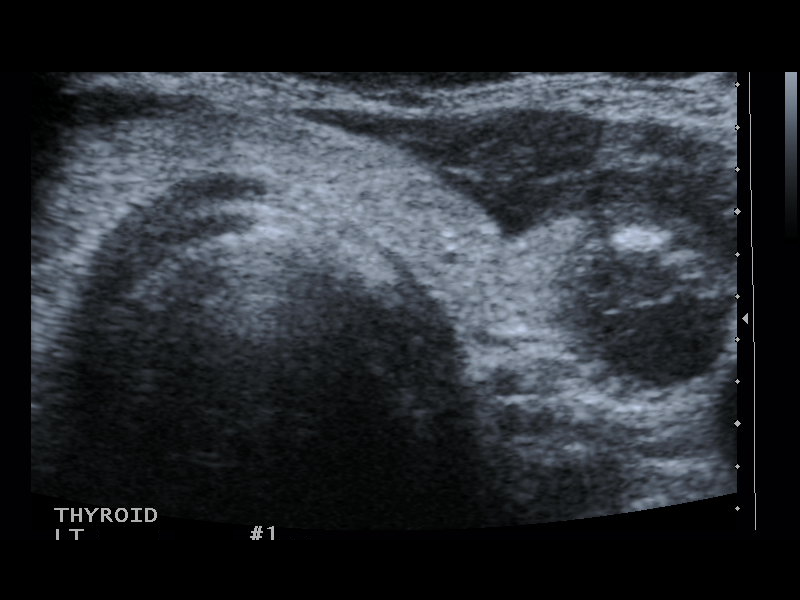
[im 2/10]
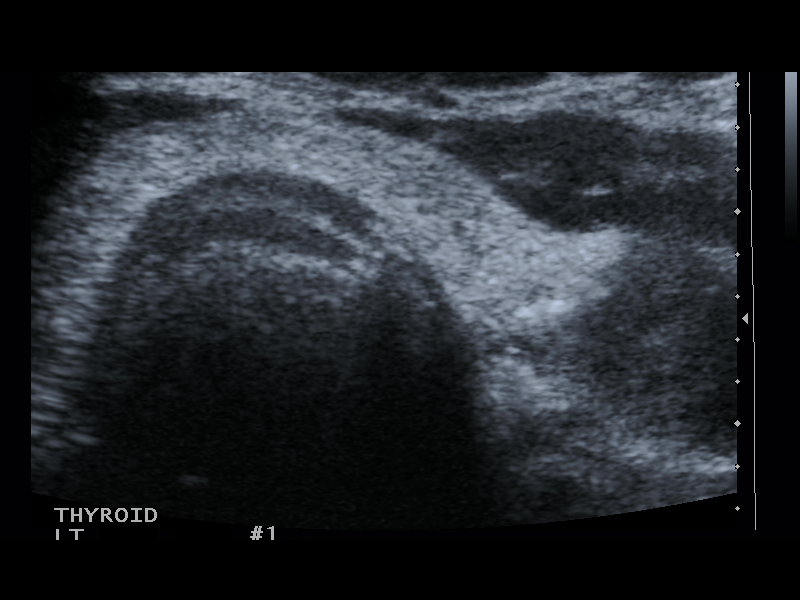
[im 3/10]
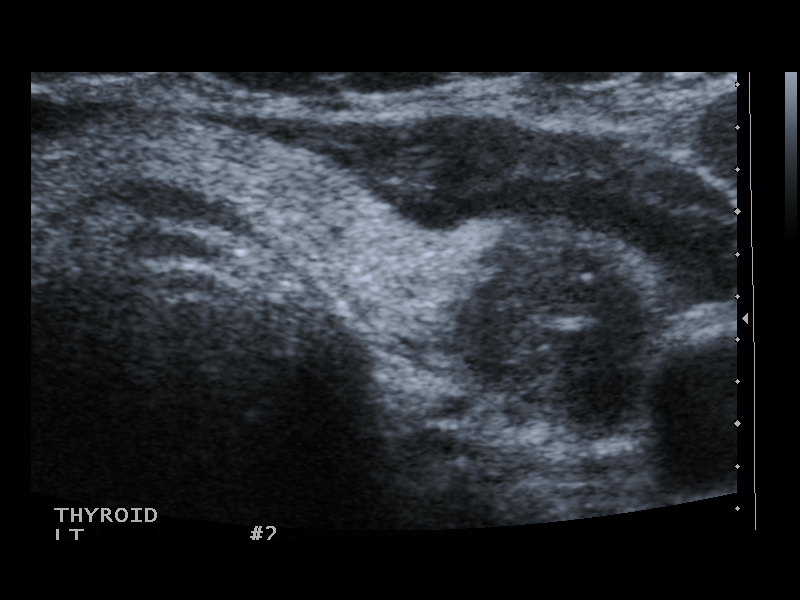
[im 4/10]
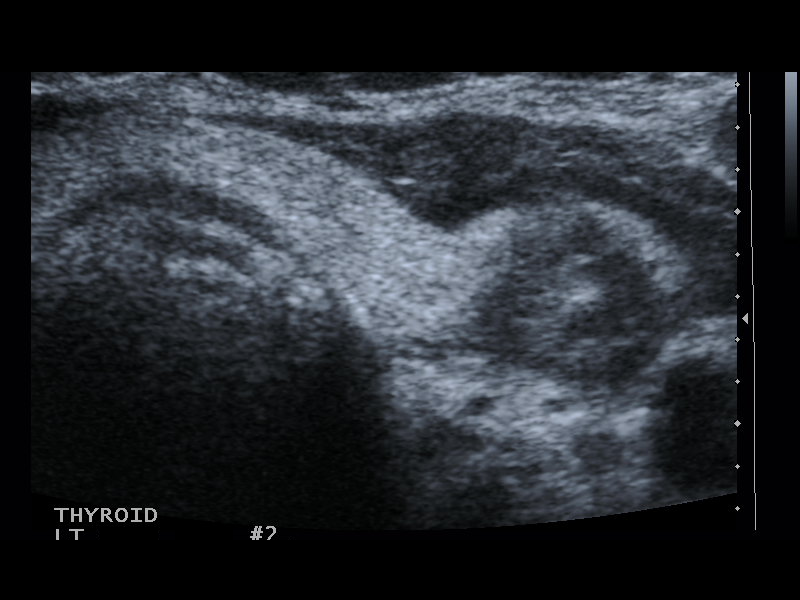
[im 5/10]
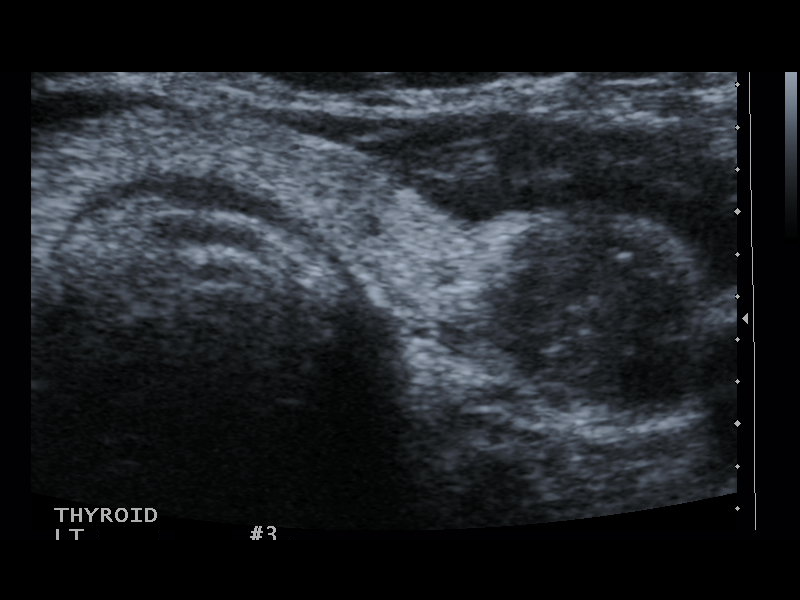
[im 6/10]
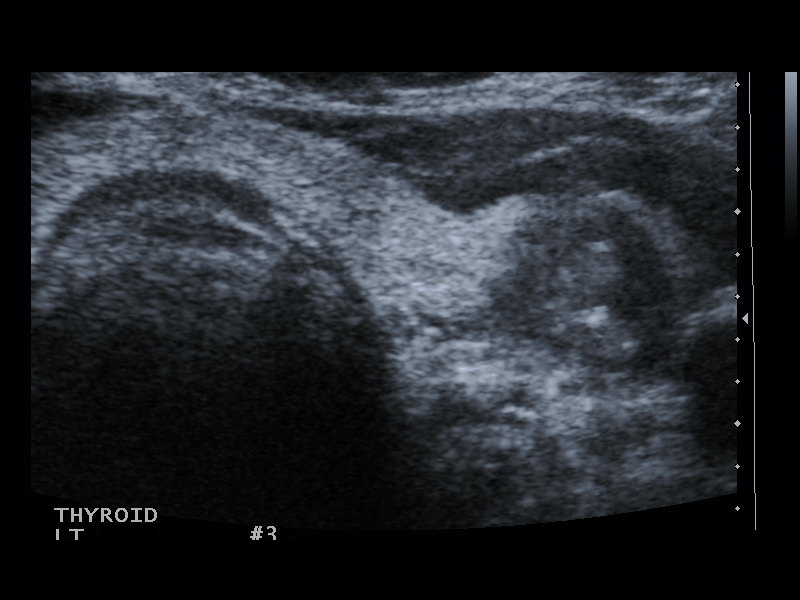
[im 7/10]
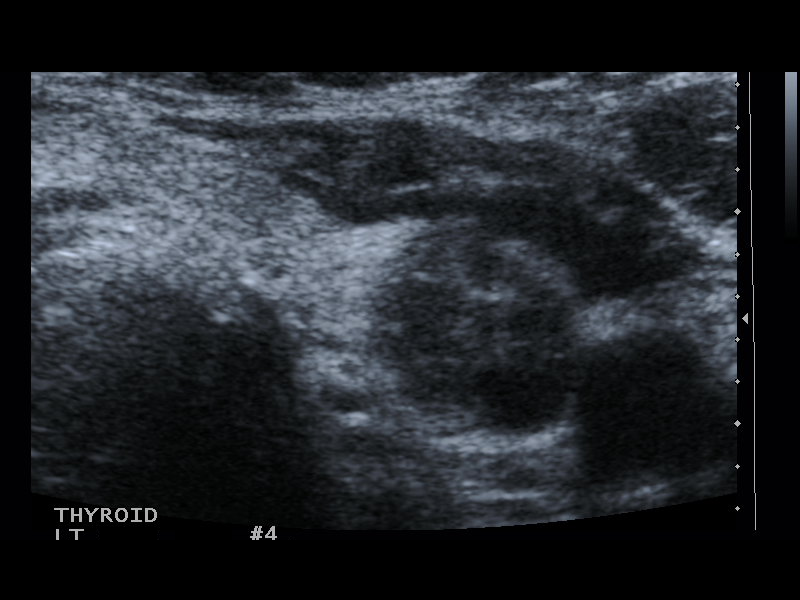
[im 8/10]
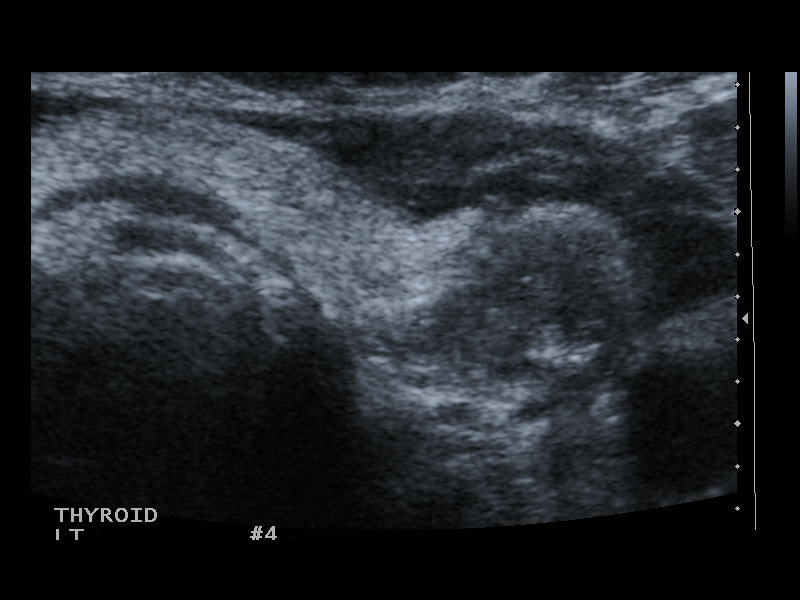
[im 9/10]
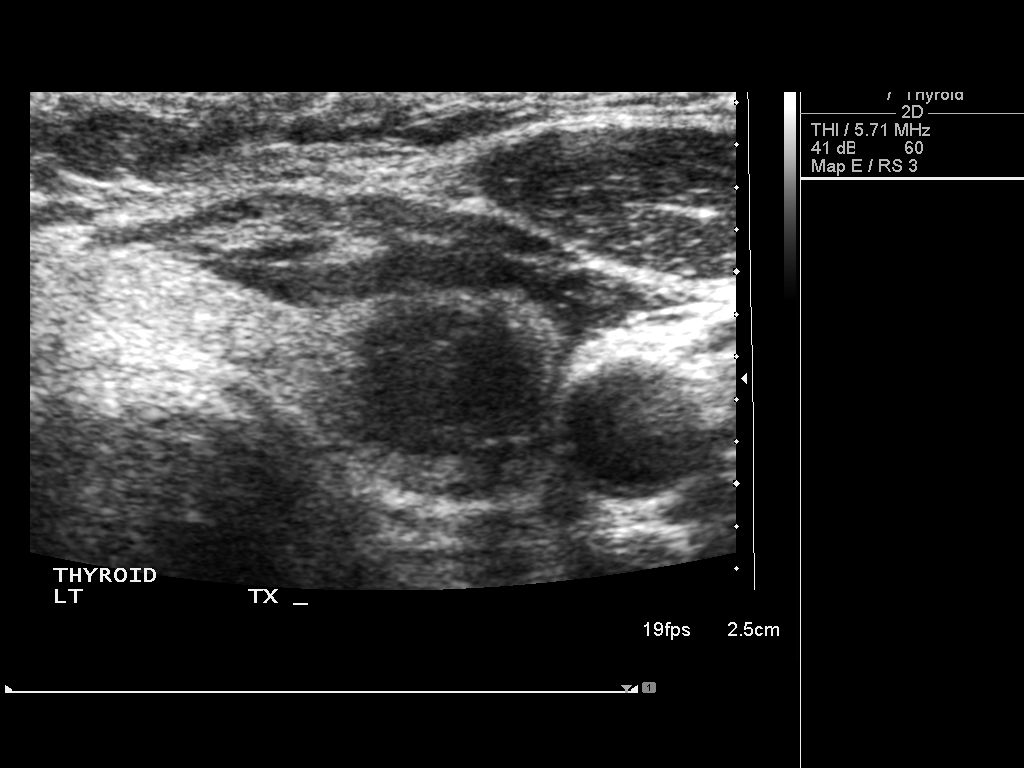
[im 10/10]
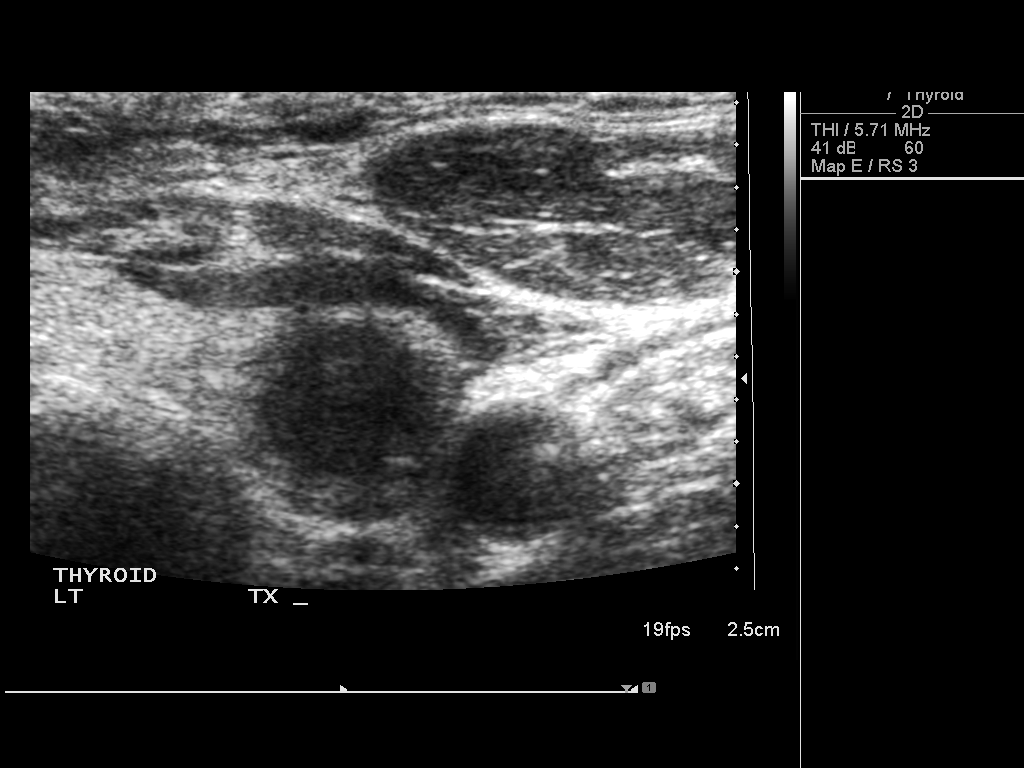

[10 of 10 positions shown; findings below may reference images not displayed]

PROCEDURE:
Thyroid biopsy was thoroughly discussed with the patient and
questions were answered. The benefits, risks, alternatives, and
complications were also discussed. The patient understands and
wishes to proceed with the procedure. Written consent was obtained.

The complex left-sided thyroid nodule has markedly reduced in size
and now measures 1.4 cm in maximal diameter. It is however
completely solid now in the fluid component has resolved. Biopsy
will be performed.



COMPLICATIONS:
None.
FINDINGS: Images document that the large left-sided complex nodule is now a
solid 1.4 cm nodule. Subsequent images demonstrate needle placement
in the left thyroid nodule.
IMPRESSION: Ultrasound guided needle aspirate biopsy performed of the left
thyroid nodule.

## 2016-10-01 ENCOUNTER — Encounter: Payer: Self-pay | Admitting: Nurse Practitioner

## 2016-10-01 ENCOUNTER — Encounter (INDEPENDENT_AMBULATORY_CARE_PROVIDER_SITE_OTHER): Payer: Self-pay

## 2016-10-01 ENCOUNTER — Ambulatory Visit (INDEPENDENT_AMBULATORY_CARE_PROVIDER_SITE_OTHER): Payer: Managed Care, Other (non HMO) | Admitting: Nurse Practitioner

## 2016-10-01 VITALS — BP 116/104 | HR 80 | Ht 67.0 in | Wt 175.5 lb

## 2016-10-01 DIAGNOSIS — R1310 Dysphagia, unspecified: Secondary | ICD-10-CM

## 2016-10-01 DIAGNOSIS — K219 Gastro-esophageal reflux disease without esophagitis: Secondary | ICD-10-CM | POA: Diagnosis not present

## 2016-10-01 MED ORDER — OMEPRAZOLE 20 MG PO CPDR: 20.0000 mg | DELAYED_RELEASE_CAPSULE | Freq: Every day | ORAL | 3 refills | Status: DC

## 2016-10-01 NOTE — Patient Instructions (Signed)
If you are age 45 or older, your body mass index should be between 23-30. Your Body mass index is 27.49 kg/m. If this is out of the aforementioned range listed, please consider follow up with your Primary Care Provider.  If you are age 54 or younger, your body mass index should be between 19-25. Your Body mass index is 27.49 kg/m. If this is out of the aformentioned range listed, please consider follow up with your Primary Care Provider.   You have been scheduled for an endoscopy. Please follow written instructions given to you at your visit today. If you use inhalers (even only as needed), please bring them with you on the day of your procedure. Your physician has requested that you go to www.startemmi.com and enter the access code given to you at your visit today. This web site gives a general overview about your procedure. However, you should still follow specific instructions given to you by our office regarding your preparation for the procedure.  We have sent the following medications to your pharmacy for you to pick up at your convenience: Prilosec 20 mg  STOP DIET PILLS 10 DAYS PRIOR TO PROCEDURE  You have been given GERD literature handout.  Thank you for choosing me and [facility].   Willette Cluster, NP

## 2016-10-01 NOTE — Progress Notes (Addendum)
     HPI: Patient is a 46 year old female who I saw in 2016 for evaluation of dysphagia. She was scheduled for an EGD but forgot about the procedure while at the beach. Patient has returned for persistent and progressive dysphagia.. Meat most problematict but she has problems swallowing other solids as well. It used to be that swallowing fluids would help solids pass but not so effective anymore. She gets heartburn about twice a week, mainly at night. She takes omeprazole as needed. No odynophagia. No chest pain. No shortness of breath. No abdominal pain, no bowel changes   Past Medical History:  Diagnosis Date  . Esophageal stricture   . GERD (gastroesophageal reflux disease)   . Hearing loss   . Wrist fracture, right    hx    Patient's surgical history, family medical history, social history, medications and allergies were all reviewed in Epic    Physical Exam: Ht 5\' 7"  (1.702 m)   Wt 175 lb 8 oz (79.6 kg)   BMI 27.49 kg/m   GENERAL: well developed white female in NAD PSYCH: :Pleasant, cooperative, normal affect EENT:  conjunctiva pink, mucous membranes moist, neck supple without masses CARDIAC:  RRR, no murmur heard, no peripheral edema PULM: Normal respiratory effort, lungs CTA bilaterally, no wheezing ABDOMEN:  soft, nontender, nondistended, no obvious masses, no hepatomegaly,  normal bowel sounds SKIN:  turgor, no lesions seen Musculoskeletal:  Normal muscle tone, normal strength NEURO: Alert and oriented x 3, no focal neurologic deficits    ASSESSMENT and PLAN:  1. Pleasant 46 year old female with chronic solid food dysphagia. Scheduled her EGD with possible dilation in 2016 but she didn't have it done. She has some procedure related anxiety. Dysphagia has not improved, actually has progressed since I saw her in 2016. I offered barium swallow with tablet but she wants to proceed with EGD with possible dilation.  -The risks and benefits of EGD with possible dilation were  discussed and the patient agrees to proceed.  -hold phenteramine 10 days prior to EGD -Advised patient to eat small bites, chew well with liquids in between bites to avoid food impaction.   2. GERD. Heartburn about twice a week, takes Omeprazole as needed -I asked her to increase PPI to q am for now. It could possible help with the dysphagia.  -GERD literature   Willette Cluster , NP 10/01/2016, 9:25 AM   Agree with Ms. Matis Monnier's assessment and plan. Iva Boop, MD, [facility]

## 2016-10-10 HISTORY — PX: ESOPHAGOGASTRODUODENOSCOPY (EGD) WITH ESOPHAGEAL DILATION: SHX5812

## 2016-10-20 ENCOUNTER — Encounter: Payer: Self-pay | Admitting: Internal Medicine

## 2016-10-25 ENCOUNTER — Encounter: Payer: Self-pay | Admitting: Internal Medicine

## 2016-10-29 ENCOUNTER — Encounter: Payer: Self-pay | Admitting: Internal Medicine

## 2016-10-29 ENCOUNTER — Ambulatory Visit (AMBULATORY_SURGERY_CENTER): Payer: Managed Care, Other (non HMO) | Admitting: Internal Medicine

## 2016-10-29 VITALS — BP 122/74 | HR 67 | Temp 97.8°F | Resp 16 | Ht 67.0 in | Wt 175.0 lb

## 2016-10-29 DIAGNOSIS — K2100 Gastro-esophageal reflux disease with esophagitis, without bleeding: Secondary | ICD-10-CM

## 2016-10-29 DIAGNOSIS — K317 Polyp of stomach and duodenum: Secondary | ICD-10-CM

## 2016-10-29 DIAGNOSIS — K222 Esophageal obstruction: Secondary | ICD-10-CM | POA: Diagnosis not present

## 2016-10-29 DIAGNOSIS — R1310 Dysphagia, unspecified: Secondary | ICD-10-CM

## 2016-10-29 MED ORDER — SODIUM CHLORIDE 0.9 % IV SOLN: 500.0000 mL | INTRAVENOUS | Status: DC

## 2016-10-29 MED ORDER — PANTOPRAZOLE SODIUM 40 MG PO TBEC: 40.0000 mg | DELAYED_RELEASE_TABLET | Freq: Every day | ORAL | 3 refills | Status: DC

## 2016-10-29 NOTE — Progress Notes (Signed)
Called to room to assist during endoscopic procedure.  Patient ID and intended procedure confirmed with present staff. Received instructions for my participation in the procedure from the performing physician.  

## 2016-10-29 NOTE — Progress Notes (Signed)
Report to PACU, RN, vss, BBS= Clear.  

## 2016-10-29 NOTE — Patient Instructions (Addendum)
There was a stricture with inflammation where esophagus and stomach meet. I dilated this which should improve things. I want you to take 40 mg omeprazole daily then change to 40 mg pantoprazole daily.  Follow reflux diet (see info).  Also saw a few tiny gastric polyps - look benign and doubt are a problem.  6 cm hiatal hernia also. Part of GERD issue  Once I see biopsies will let you know and arrange a follow-up appointment. Treatment of GERD should reduce chances of needing this again.  I appreciate the opportunity to care for you. Iva Boop, MD, FACG YOU HAD AN ENDOSCOPIC PROCEDURE TODAY AT THE [facility]:   Refer to the procedure report that was given to you for any specific questions about what was found during the examination.  If the procedure report does not answer your questions, please call your gastroenterologist to clarify.  If you requested that your care partner not be given the details of your procedure findings, then the procedure report has been included in a sealed envelope for you to review at your convenience later.  YOU SHOULD EXPECT: Some feelings of bloating in the abdomen. Passage of more gas than usual.  Walking can help get rid of the air that was put into your GI tract during the procedure and reduce the bloating. If you had a lower endoscopy (such as a colonoscopy or flexible sigmoidoscopy) you may notice spotting of blood in your stool or on the toilet paper. If you underwent a bowel prep for your procedure, you may not have a normal bowel movement for a few days.  Please Note:  You might notice some irritation and congestion in your nose or some drainage.  This is from the oxygen used during your procedure.  There is no need for concern and it should clear up in a day or so.  SYMPTOMS TO REPORT IMMEDIATELY:   Following upper endoscopy (EGD)  Vomiting of blood or coffee ground material  New chest pain or pain under the shoulder  blades  Painful or persistently difficult swallowing  New shortness of breath  Fever of 100F or higher  Black, tarry-looking stools  For urgent or emergent issues, a gastroenterologist can be reached at any hour by calling [phone].   DIET:  Follow a post-dilation diet. Clear liquids for one hour starting at 12:40, followed by a soft diet for the rest of the day today (see handout given to you by your recovery nurse). You may resume your regular diet tomorrow as tolerated. Drink plenty of fluids but you should avoid alcoholic beverages for 24 hours.  MEDICATIONS:  Continue present medications. Use Prilosec (Omeprazole) 40 mg by mouth daily. Finish current Omeprazole prescription at 40 mg daily and then start Pantoprazole 40 mg by mouth daily (prescription given to patient).  Follow an anti-reflux regimen (please see handouts given to you by your recovery nurse.  Dr. Leone Payor will arrange follow-up after reviewing your pathology report.  ACTIVITY:  You should plan to take it easy for the rest of today and you should NOT DRIVE or use heavy machinery until tomorrow (because of the sedation medicines used during the test).    FOLLOW UP: Our staff will call the number listed on your records the next business day following your procedure to check on you and address any questions or concerns that you may have regarding the information given to you following your procedure. If we do not reach you, we will  leave a message.  However, if you are feeling well and you are not experiencing any problems, there is no need to return our call.  We will assume that you have returned to your regular daily activities without incident.  If any biopsies were taken you will be contacted by phone or by letter within the next 1-3 weeks.  Please call us at [phone] if you have not heard about the biopsies in 3 weeks.  Thank you for allowing [place] to provide for your healthcare needs today.     SIGNATURES/CONFIDENTIALITY: You and/or your care partner have signed paperwork which will be entered into your electronic medical record.  These signatures attest to the fact that that the information above on your After Visit Summary has been reviewed and is understood.  Full responsibility of the confidentiality of this discharge information lies with you and/or your care-partner.

## 2016-10-29 NOTE — Op Note (Signed)
[facility] Patient Name: Stacey Dean Procedure Date: 10/29/2016 11:07 AM MRN: [id number] Endoscopist: Iva Boop , MD Age: 46 Referring MD:  Date of Birth: 1970/06/15 Gender: Female Account #: [id number] Procedure:                Upper GI endoscopy Indications:              Dysphagia Medicines:                Propofol per Anesthesia, Monitored Anesthesia Care Procedure:                Pre-Anesthesia Assessment:                           - Prior to the procedure, a History and Physical                            was performed, and patient medications and                            allergies were reviewed. The patient's tolerance of                            previous anesthesia was also reviewed. The risks                            and benefits of the procedure and the sedation                            options and risks were discussed with the patient.                            All questions were answered, and informed consent                            was obtained. Prior Anticoagulants: The patient has                            taken no previous anticoagulant or antiplatelet                            agents. ASA Grade Assessment: II - A patient with                            mild systemic disease. After reviewing the risks                            and benefits, the patient was deemed in                            satisfactory condition to undergo the procedure.                           After obtaining informed consent, the endoscope was  passed under direct vision. Throughout the                            procedure, the patient's blood pressure, pulse, and                            oxygen saturations were monitored continuously. The                            Endoscope was introduced through the mouth, and                            advanced to the second part of duodenum. The upper                            GI endoscopy  was accomplished without difficulty.                            The patient tolerated the procedure well. Scope In: Scope Out: Findings:                 One moderate benign-appearing, intrinsic stenosis                            was found at the gastroesophageal junction. And was                            traversed. A TTS dilator was passed through the                            scope. Dilation with an 18-19-20 mm balloon dilator                            was performed to 18 mm. The dilation site was                            examined and showed mild improvement in luminal                            narrowing. Estimated blood loss was minimal.                           LA Grade A (one or more mucosal breaks less than 5                            mm, not extending between tops of 2 mucosal folds)                            esophagitis with no bleeding was found in the                            distal esophagus.  A 6 cm hiatal hernia was present.                           A few diminutive sessile polyps with no stigmata of                            recent bleeding were found in the gastric body.                            Biopsies were taken with a cold forceps for                            histology. Estimated blood loss was minimal.                           The cardia and gastric fundus were normal on                            retroflexion.                           Diffuse granular mucosa was found in the duodenal                            bulb. Complications:            No immediate complications. Estimated Blood Loss:     Estimated blood loss was minimal. Impression:               - Benign-appearing esophageal stenosis. Dilated.                           - LA Grade A reflux esophagitis.                           - 6 cm hiatal hernia.                           - A few gastric polyps. Biopsied.                           - Granular mucosa in the  duodenal bulb. Recommendation:           - Patient has a contact number available for                            emergencies. The signs and symptoms of potential                            delayed complications were discussed with the                            patient. Return to normal activities tomorrow.                            Written discharge instructions were provided to the  patient.                           - Clear liquids x 1 hour then soft foods rest of                            day. Start prior diet tomorrow.                           - Continue present medications.                           - Use Prilosec (omeprazole) 40 mg PO daily.                           - Finish current omeprazole rx at 40 mg daily and                            then start pantoprazole 40 mg qd - Rx given                           GERD lifestyle changes - handout provided                           will arrange f/u after pathology review Iva Boop, MD 10/29/2016 11:48:06 AM This report has been signed electronically.

## 2016-10-30 ENCOUNTER — Telehealth: Payer: Self-pay | Admitting: *Deleted

## 2016-10-30 NOTE — Telephone Encounter (Signed)
  Follow up Call-  Call back number 10/29/2016  Post procedure Call Back phone  # [phone]  Permission to leave phone message Yes  Some recent data might be hidden     Patient questions:  Do you have a fever, pain , or abdominal swelling? No. Pain Score  0 *  Have you tolerated food without any problems? Yes.    Have you been able to return to your normal activities? Yes.    Do you have any questions about your discharge instructions: Diet   No. Medications  No. Follow up visit  No.  Do you have questions or concerns about your Care? No.  Actions: * If pain score is 4 or above: No action needed, pain <4.

## 2016-11-08 ENCOUNTER — Encounter: Payer: Self-pay | Admitting: Internal Medicine

## 2016-11-08 NOTE — Progress Notes (Signed)
Polyps benign and no follow-up needed for these She had GERD and stricture dilated  Please call from office and: 1) get sx update - expect dilation helped - if not better let me know - and remind her take PPI daily - if having difficulty remembering the pantoprazole and taking daily before supper or in AM (waiting due to Synthroid) we can Rx Zgerid 40 mg qhs instead # 90 3 RF 2) Set up REV next available to review long-term management  3) [facility] no letter/recall

## 2017-01-07 ENCOUNTER — Ambulatory Visit: Payer: Managed Care, Other (non HMO) | Admitting: Internal Medicine

## 2017-12-31 ENCOUNTER — Other Ambulatory Visit: Payer: Self-pay | Admitting: Internal Medicine

## 2018-06-06 ENCOUNTER — Telehealth: Payer: Self-pay | Admitting: Internal Medicine

## 2018-06-06 MED ORDER — PANTOPRAZOLE SODIUM 40 MG PO TBEC: DELAYED_RELEASE_TABLET | ORAL | 0 refills | Status: DC

## 2018-06-06 NOTE — Telephone Encounter (Signed)
Pantoprazole refilled. 

## 2018-08-31 ENCOUNTER — Other Ambulatory Visit: Payer: Self-pay

## 2018-08-31 MED ORDER — PANTOPRAZOLE SODIUM 40 MG PO TBEC: DELAYED_RELEASE_TABLET | ORAL | 0 refills | Status: DC

## 2018-08-31 NOTE — Telephone Encounter (Signed)
Pantoprazole refilled as pharmacy requested. 

## 2018-12-02 ENCOUNTER — Telehealth: Payer: Self-pay | Admitting: Internal Medicine

## 2018-12-02 MED ORDER — PANTOPRAZOLE SODIUM 40 MG PO TBEC: DELAYED_RELEASE_TABLET | ORAL | 0 refills | Status: DC

## 2018-12-02 NOTE — Telephone Encounter (Signed)
Pt requested a refill for protonix.

## 2018-12-02 NOTE — Telephone Encounter (Signed)
Spoke with Darneshia to confirm pharmacy and made her a follow up appointment for December as she has not been seen since 2018. Sent in a refill of her pantoprazole to cover her until appointment.

## 2019-01-17 ENCOUNTER — Ambulatory Visit: Payer: Managed Care, Other (non HMO) | Admitting: Internal Medicine

## 2019-01-26 ENCOUNTER — Ambulatory Visit: Payer: Managed Care, Other (non HMO) | Admitting: Internal Medicine

## 2019-03-08 ENCOUNTER — Ambulatory Visit: Payer: Self-pay | Admitting: Internal Medicine

## 2019-03-09 ENCOUNTER — Telehealth: Payer: Self-pay | Admitting: Internal Medicine

## 2019-03-09 MED ORDER — PANTOPRAZOLE SODIUM 40 MG PO TBEC: DELAYED_RELEASE_TABLET | ORAL | 0 refills | Status: DC

## 2019-03-09 NOTE — Telephone Encounter (Signed)
Pantoprazole refilled to cover until appointment.

## 2019-03-29 ENCOUNTER — Ambulatory Visit: Payer: BC Managed Care – PPO | Admitting: Internal Medicine

## 2019-03-29 ENCOUNTER — Encounter: Payer: Self-pay | Admitting: Internal Medicine

## 2019-03-29 VITALS — BP 128/84 | HR 76 | Temp 98.7°F | Ht 67.0 in | Wt 192.0 lb

## 2019-03-29 DIAGNOSIS — K219 Gastro-esophageal reflux disease without esophagitis: Secondary | ICD-10-CM | POA: Diagnosis not present

## 2019-03-29 DIAGNOSIS — K222 Esophageal obstruction: Secondary | ICD-10-CM | POA: Diagnosis not present

## 2019-03-29 MED ORDER — PANTOPRAZOLE SODIUM 40 MG PO TBEC: DELAYED_RELEASE_TABLET | ORAL | 3 refills | Status: DC

## 2019-03-29 NOTE — Progress Notes (Signed)
   Stacey Dean 48 y.o. 05-Jan-1971 [id number]  Assessment & Plan:   Encounter Diagnosis  Name Primary?  . GERD with stricture Yes    Symptomatic with recurrent dysphagia Repeat EGD, dilation Refill PPI  VQ:[id number], Onalee Hua, MD     Subjective:   Chief Complaint: GERD f/u  HPI 49 yo ww - hx GERD with stricture dilation 18 mm in 10/2016. 6 cm hiatal hernia  About 1x/momth has dysphagia to meat e.g. [place] Also 1x/month nocturnal regurgitation Off pantoprazole x 1-2 weeks - ran out  Wt Readings from Last 3 Encounters:  03/29/19 192 lb (87.1 kg)  10/29/16 175 lb (79.4 kg)  10/01/16 175 lb 8 oz (79.6 kg)    No Known Allergies Current Meds  Medication Sig  . pantoprazole (PROTONIX) 40 MG tablet TAKE 1 TABLET BY MOUTH EVERY DAY BEFORE BREAKFAST  . [DISCONTINUED] pantoprazole (PROTONIX) 40 MG tablet TAKE 1 TABLET BY MOUTH EVERY DAY BEFORE BREAKFAST   Past Medical History:  Diagnosis Date  . Anxiety   . Esophageal stricture   . Fundic gland polyposis of stomach   . GERD (gastroesophageal reflux disease)   . Hearing loss   . Thyroid disease 2015   hypothyroidism  . Wrist fracture, right    hx   Past Surgical History:  Procedure Laterality Date  . CERVIX LESION DESTRUCTION    . ESOPHAGOGASTRODUODENOSCOPY (EGD) WITH ESOPHAGEAL DILATION  10/2016  . WRIST ARTHROSCOPY     Social History   Social History Narrative   Married, 2 kids   Sales rep [employer]   family history includes Breast cancer in her maternal grandmother; Esophageal cancer in her paternal uncle; Healthy in her mother; Heart disease in her father and maternal uncle; Kidney cancer in her maternal grandmother; Lung cancer in her maternal grandfather; Stroke in her paternal grandfather.   Review of Systems As above  Objective:   Physical Exam BP 128/84   Pulse 76   Temp 98.7 F (37.1 C)   Ht 5\' 7"  (1.702 m)   Wt 192 lb (87.1 kg)   BMI 30.07 kg/m  NAD

## 2019-03-29 NOTE — Patient Instructions (Signed)
  You have been scheduled for an endoscopy. Please follow written instructions given to you at your visit today. If you use inhalers (even only as needed), please bring them with you on the day of your procedure.   We have sent the following medications to your pharmacy for you to pick up at your convenience: Pantoprazole   I appreciate the opportunity to care for you. Carl Gessner, MD, FACG 

## 2019-04-14 ENCOUNTER — Ambulatory Visit (INDEPENDENT_AMBULATORY_CARE_PROVIDER_SITE_OTHER): Payer: BC Managed Care – PPO

## 2019-04-14 DIAGNOSIS — Z1159 Encounter for screening for other viral diseases: Secondary | ICD-10-CM

## 2019-04-18 ENCOUNTER — Encounter: Payer: BC Managed Care – PPO | Admitting: Internal Medicine

## 2019-05-17 ENCOUNTER — Ambulatory Visit (INDEPENDENT_AMBULATORY_CARE_PROVIDER_SITE_OTHER): Payer: BC Managed Care – PPO

## 2019-05-17 ENCOUNTER — Other Ambulatory Visit: Payer: Self-pay | Admitting: Internal Medicine

## 2019-05-17 DIAGNOSIS — Z1159 Encounter for screening for other viral diseases: Secondary | ICD-10-CM

## 2019-05-18 LAB — SARS CORONAVIRUS 2 (TAT 6-24 HRS): SARS Coronavirus 2: NEGATIVE

## 2019-05-19 ENCOUNTER — Other Ambulatory Visit: Payer: Self-pay

## 2019-05-19 ENCOUNTER — Ambulatory Visit (AMBULATORY_SURGERY_CENTER): Payer: BC Managed Care – PPO | Admitting: Internal Medicine

## 2019-05-19 VITALS — BP 136/86 | HR 73 | Temp 96.6°F | Resp 18

## 2019-05-19 DIAGNOSIS — K2100 Gastro-esophageal reflux disease with esophagitis, without bleeding: Secondary | ICD-10-CM

## 2019-05-19 DIAGNOSIS — K449 Diaphragmatic hernia without obstruction or gangrene: Secondary | ICD-10-CM | POA: Diagnosis not present

## 2019-05-19 DIAGNOSIS — K222 Esophageal obstruction: Secondary | ICD-10-CM | POA: Diagnosis not present

## 2019-05-19 DIAGNOSIS — K219 Gastro-esophageal reflux disease without esophagitis: Secondary | ICD-10-CM

## 2019-05-19 DIAGNOSIS — R1310 Dysphagia, unspecified: Secondary | ICD-10-CM

## 2019-05-19 MED ORDER — SODIUM CHLORIDE 0.9 % IV SOLN: 500.0000 mL | Freq: Once | INTRAVENOUS | Status: DC

## 2019-05-19 NOTE — Progress Notes (Signed)
Patient consents to observer being present for procedure.   

## 2019-05-19 NOTE — Progress Notes (Signed)
Called to room to assist during endoscopic procedure.  Patient ID and intended procedure confirmed with present staff. Received instructions for my participation in the procedure from the performing physician.  

## 2019-05-19 NOTE — Op Note (Addendum)
[place] Patient Name: Stacey Dean Procedure Date: 05/19/2019 10:10 AM MRN: [id number] Endoscopist: Gatha Mayer , MD Age: 49 Referring MD:  Date of Birth: [birth year] Gender: Female Account #: [id number] Procedure:                Upper GI endoscopy Indications:              Dysphagia, Heartburn Medicines:                Propofol per Anesthesia, Monitored Anesthesia Care Procedure:                Pre-Anesthesia Assessment:                           - Prior to the procedure, a History and Physical                            was performed, and patient medications and                            allergies were reviewed. The patient's tolerance of                            previous anesthesia was also reviewed. The risks                            and benefits of the procedure and the sedation                            options and risks were discussed with the patient.                            All questions were answered, and informed consent                            was obtained. Prior Anticoagulants: The patient has                            taken no previous anticoagulant or antiplatelet                            agents. ASA Grade Assessment: II - A patient with                            mild systemic disease. After reviewing the risks                            and benefits, the patient was deemed in                            satisfactory condition to undergo the procedure.                           After obtaining informed consent, the endoscope was  passed under direct vision. Throughout the                            procedure, the patient's blood pressure, pulse, and                            oxygen saturations were monitored continuously. The                            Endoscope was introduced through the mouth, and                            advanced to the second part of duodenum. The upper                            GI  endoscopy was accomplished without difficulty.                            The patient tolerated the procedure well. Scope In: Scope Out: Findings:                 LA Grade A (one or more mucosal breaks less than 5                            mm, not extending between tops of 2 mucosal folds)                            esophagitis with no bleeding was found at the                            gastroesophageal junction.                           One benign-appearing, intrinsic moderate stenosis                            was found at the gastroesophageal junction. The                            stenosis was traversed. A TTS dilator was passed                            through the scope. Dilation with an 18-19-20 mm                            balloon dilator was performed to 20 mm. The                            dilation site was examined and showed mild mucosal                            disruption. Estimated blood loss was minimal.  A 6 cm hiatal hernia was present.                           The exam was otherwise without abnormality.                           The cardia and gastric fundus were normal on                            retroflexion. Complications:            No immediate complications. Estimated Blood Loss:     Estimated blood loss was minimal. Impression:               - LA Grade A reflux esophagitis with no bleeding.                           - Benign-appearing esophageal stenosis. Dilated.                           - 6 cm hiatal hernia.                           - The examination was otherwise normal.                           - No specimens collected. Recommendation:           - Patient has a contact number available for                            emergencies. The signs and symptoms of potential                            delayed complications were discussed with the                            patient. Return to normal activities tomorrow.                             Written discharge instructions were provided to the                            patient.                           - Clear liquids x 1 hour then soft foods rest of                            day. Start prior diet tomorrow.                           - Continue present medications.                           - Lifestyle chanmges - try to lose weight, ? Hernia  repair/TIF combo - will discuss - did so in                            recovery - she is interested in referral so will                            have her see Dr. Lenna Gilford, MD 05/19/2019 10:47:15 AM This report has been signed electronically.

## 2019-05-19 NOTE — Patient Instructions (Addendum)
The stricture and one spot of inflammation seen again. You still have the hiatal hernia.  Stay on current medication.  I know it is hard but losing weight will make a big difference.  Another consideration would be surgical correction of hiatal hernia but they would most likely encourage weight loss first.  One of the hardest things about weight loss is people restricting calories and being hungry, as well as finding a diet that can be life long. Below are some other tips and some links.  Healthy and nutritious eating and weight loss are made to be harder than they need to be. A simplified diet approach based around eating normally, as much as you want without restricting intake too much is better, I think. You must avoid packaged foods and try to eat real foods as much as possible. Packaged foods, sugary sodas, highly processed foods taste great but are slow poisons that lead to obesity and/or poor health.  It is very helpful to take some time each week and plan your meals. Work with your spouse, partner, family to do this as much as possible. Preparing meals ahead to take to work or school is especially helpful and will save money, too. You can do this and by working together it can take less time.  Another way to help is to order food on-line for pick-up (or delivery if you can afford) and you will avoid impulse buys of unhealthy foods.  Some resources that I like are:   www.dietdoctor.com - helps with low carb diets and also can learn about and consider intermittent  fasting. If you have diabetes would not do intermittent fasting without checking with your doctor. Best to change what you eat before doing this.  Www.drberry.com   Here are some guidelines to help you with meal planning -  Avoid all processed and packaged foods (bread, pasta, crackers, chips, etc) and beverages containing calories.  Avoid added sugars and excessive natural sugars.  Attention to how you feel if you consume  artificial sweeteners.  Do they make you more hungry or raise your blood sugar?  With every meal and snack, aim to get 20 g of protein (3 ounces of meat, 4 ounces of fish, 3 eggs, protein powder, 1 cup [place] yogurt, 1 cup cottage cheese, etc.)  Increase fiber in the form of non-starchy vegetables.  These help you feel full with very little carbohydrates and are good for gut health.  .   YOU HAD AN ENDOSCOPIC PROCEDURE TODAY AT THE [facility]:   Refer to the procedure report that was given to you for any specific questions about what was found during the examination.  If the procedure report does not answer your questions, please call your gastroenterologist to clarify.  If you requested that your care partner not be given the details of your procedure findings, then the procedure report has been included in a sealed envelope for you to review at your convenience later.  YOU SHOULD EXPECT: Some feelings of bloating in the abdomen. Passage of more gas than usual.  Walking can help get rid of the air that was put into your GI tract during the procedure and reduce the bloating. If you had a lower endoscopy (such as a colonoscopy or flexible sigmoidoscopy) you may notice spotting of blood in your stool or on the toilet paper. If you underwent a bowel prep for your procedure, you may not have a normal bowel movement for a few days.  Please Note:  You might notice some irritation and congestion in your nose or some drainage.  This is from the oxygen used during your procedure.  There is no need for concern and it should clear up in a day or so.  SYMPTOMS TO REPORT IMMEDIATELY:   Following upper endoscopy (EGD)  Vomiting of blood or coffee ground material  New chest pain or pain under the shoulder blades  Painful or persistently difficult swallowing  New shortness of breath  Fever of 100F or higher  Black, tarry-looking stools  For urgent or emergent issues, a gastroenterologist can  be reached at any hour by calling [phone]. Do not use MyChart messaging for urgent concerns.    DIET:  We do recommend a small meal at first, but then you may proceed to your regular diet.  Drink plenty of fluids but you should avoid alcoholic beverages for 24 hours.  ACTIVITY:  You should plan to take it easy for the rest of today and you should NOT DRIVE or use heavy machinery until tomorrow (because of the sedation medicines used during the test).    FOLLOW UP: Our staff will call the number listed on your records 48-72 hours following your procedure to check on you and address any questions or concerns that you may have regarding the information given to you following your procedure. If we do not reach you, we will leave a message.  We will attempt to reach you two times.  During this call, we will ask if you have developed any symptoms of COVID 19. If you develop any symptoms (ie: fever, flu-like symptoms, shortness of breath, cough etc.) before then, please call [phone].  If you test positive for Covid 19 in the 2 weeks post procedure, please call and report this information to [place].    If any biopsies were taken you will be contacted by phone or by letter within the next 1-3 weeks.  Please call us at [phone] if you have not heard about the biopsies in 3 weeks.    SIGNATURES/CONFIDENTIALITY: You and/or your care partner have signed paperwork which will be entered into your electronic medical record.  These signatures attest to the fact that that the information above on your After Visit Summary has been reviewed and is understood.  Full responsibility of the confidentiality of this discharge information lies with you and/or your care-partner.

## 2019-05-19 NOTE — Progress Notes (Signed)
Pt's states no medical or surgical changes since previsit or office visit. 

## 2019-05-19 NOTE — Progress Notes (Signed)
A and O x3. Report to RN. Tolerated MAC anesthesia well.Teeth unchanged after procedure.

## 2019-05-23 ENCOUNTER — Telehealth: Payer: Self-pay

## 2019-05-23 NOTE — Telephone Encounter (Signed)
No answer, left message to call back later today, B.Ashe Gago RN. 

## 2019-05-23 NOTE — Telephone Encounter (Signed)
Left message on 2nd follow up call. 

## 2019-06-15 ENCOUNTER — Encounter: Payer: Self-pay | Admitting: Gastroenterology

## 2019-08-02 ENCOUNTER — Ambulatory Visit: Payer: BC Managed Care – PPO | Admitting: Gastroenterology

## 2019-08-02 ENCOUNTER — Encounter: Payer: Self-pay | Admitting: Gastroenterology

## 2019-08-02 VITALS — BP 130/82 | HR 95 | Temp 97.1°F | Ht 66.0 in | Wt 193.0 lb

## 2019-08-02 DIAGNOSIS — K219 Gastro-esophageal reflux disease without esophagitis: Secondary | ICD-10-CM | POA: Diagnosis not present

## 2019-08-02 DIAGNOSIS — K449 Diaphragmatic hernia without obstruction or gangrene: Secondary | ICD-10-CM

## 2019-08-02 DIAGNOSIS — K222 Esophageal obstruction: Secondary | ICD-10-CM

## 2019-08-02 NOTE — Progress Notes (Signed)
P  Chief Complaint:    GERD, peptic stricture, discussion of antireflux surgical options  Referring Physician: Dr. Stan Head   HPI:    49 year old female with a longstanding history of reflux complicated by peptic stricture and large hiatal hernia, referred to me by Dr. Leone Payor for evaluation of possible antireflux intervention with Transoral Incisionless Fundoplication (TIF).   Has had reflux sxs for many years, starting during pregnancy.   GERD history: -Index symptoms: HB, regurgitation, dysphagia (improved with dilation), ++Nocturnal regurgitation -Exacerbating factors: Chicken noodle soup, spicy foods, tomato based sauces, red wine -Medications trialed: Omeprazole, Tums, Nexium OTC -Current medications: Protonix 40 mg daily- well controlled 90-95% of the time with breakthrough 2/2 dietary discretion -Complications: Peptic stricture, hiatal hernia, erosive esophagitis  GERD evaluation: -Last EGD: 05/19/2019 -Barium esophagram: None -Esophageal Manometry: None -pH/Impedance: None  Endoscopic History: -EGD (05/19/2019, Dr. Leone Payor): LA Grade A esophagitis, lower esophageal stenosis dilated with 20 mm TTS balloon with mucosal rent, 6 cm HH -EGD (10/29/2016, Dr. Leone Payor): LA Grade A esophagitis, lower esophageal stenosis dilated with 18 mm TTS balloon with mucosal rent, 6 cm HH, fundic gland polyps  -07/07/2019: ALT 35, otherwise normal CMP.  Review of systems:     No chest pain, no SOB, no fevers, no urinary sx   Past Medical History:  Diagnosis Date  . Anxiety   . Esophageal stricture   . Fundic gland polyposis of stomach   . GERD (gastroesophageal reflux disease)   . Hearing loss   . Thyroid disease 2015   hypothyroidism  . Wrist fracture, right    hx    Patient's surgical history, family medical history, social history, medications and allergies were all reviewed in Epic    Current Outpatient Medications  Medication Sig Dispense Refill  . pantoprazole  (PROTONIX) 40 MG tablet TAKE 1 TABLET BY MOUTH EVERY DAY BEFORE BREAKFAST 90 tablet 3  . vortioxetine HBr (TRINTELLIX) 10 MG TABS Take 20 mg by mouth daily.     Marland Kitchen zolpidem (AMBIEN) 5 MG tablet Take 5 mg by mouth at bedtime as needed for sleep.     No current facility-administered medications for this visit.    Physical Exam:     BP 130/82   Pulse 95   Temp (!) 97.1 F (36.2 C)   Ht 5\' 6"  (1.676 m)   Wt 193 lb (87.5 kg)   BMI 31.15 kg/m   GENERAL:  Pleasant female in NAD PSYCH: : Cooperative, normal affect NEURO: Alert and oriented x 3, no focal neurologic deficits   IMPRESSION and PLAN:    1) GERD with Erosive Esophagitis 2) Peptic stricture 3) Hiatal hernia  Stacey Dean is a 49 y.o. female referred to me to discuss GERD and treatment options. Discussed the pathophysiology of GERD at length, to include the risks of untreated reflux (ie, strictures, Barrett's Esophagus, EAC, etc) as well as the possible treatment with medications vs antireflux surgery. In particular, we discussed the risks, benefits, and alternatives of Transoral Incisionless Fundoplication (TIF), to include Nissen fundoplication and Linx.  Given her large hiatal hernia, she may also require laparoscopic hiatal hernia repair at the same time.  She states that her reflux symptoms are otherwise well controlled with Protonix 40 mg/day.  She is not worried about the side effect profile PPIs.  She would like to read more about antireflux surgical options, namely TIF, but is leaning towards continued medical management.  -Continue Protonix as prescribed -Continue antireflux lifestyle/dietary modifications -If she would  like to proceed with antireflux surgery, my plan would be for repeat EGD with repeat dilation of peptic stricture as needed, reassessment of hernia size (>5 cm excludes TIF), with referral to [facility] -Provided with literature for review along and directed patient to informative video regarding the TIF  procedure at [email or web address]  -If she would like to proceed with further surgical work-up, can follow-up with me anytime.  Otherwise, can follow-up with Dr. Carlean Purl for continued medical management of reflux - All questions answered  I spent 35 minutes of time, including in depth chart review, independent review of results as outlined above, communicating results with the patient directly, face-to-face time with the patient, coordinating care, and ordering studies and medications as appropriate, and documentation.        [place] ,DO, FACG 08/02/2019, 9:04 AM

## 2019-08-02 NOTE — Patient Instructions (Addendum)
TIF video link  [email or web address]   Return as needed  It was a pleasure to see you today!  Malena Timpone, D.O.

## 2020-01-31 ENCOUNTER — Emergency Department (HOSPITAL_BASED_OUTPATIENT_CLINIC_OR_DEPARTMENT_OTHER)
Admission: EM | Admit: 2020-01-31 | Discharge: 2020-01-31 | Disposition: A | Discharge status: Home / Self Care | Payer: BC Managed Care – PPO | Attending: Emergency Medicine | Admitting: Emergency Medicine

## 2020-01-31 ENCOUNTER — Other Ambulatory Visit: Payer: Self-pay

## 2020-01-31 ENCOUNTER — Encounter (HOSPITAL_BASED_OUTPATIENT_CLINIC_OR_DEPARTMENT_OTHER): Payer: Self-pay

## 2020-01-31 DIAGNOSIS — E039 Hypothyroidism, unspecified: Secondary | ICD-10-CM | POA: Diagnosis not present

## 2020-01-31 DIAGNOSIS — S61258A Open bite of other finger without damage to nail, initial encounter: Secondary | ICD-10-CM

## 2020-01-31 DIAGNOSIS — S61250A Open bite of right index finger without damage to nail, initial encounter: Secondary | ICD-10-CM | POA: Insufficient documentation

## 2020-01-31 DIAGNOSIS — Z23 Encounter for immunization: Secondary | ICD-10-CM | POA: Diagnosis not present

## 2020-01-31 DIAGNOSIS — W5501XA Bitten by cat, initial encounter: Secondary | ICD-10-CM | POA: Insufficient documentation

## 2020-01-31 MED ORDER — AMOXICILLIN-POT CLAVULANATE 875-125 MG PO TABS: 1.0000 | ORAL_TABLET | Freq: Two times a day (BID) | ORAL | 0 refills | Status: DC

## 2020-01-31 MED ORDER — TETANUS-DIPHTH-ACELL PERTUSSIS 5-2.5-18.5 LF-MCG/0.5 IM SUSY
0.5000 mL | PREFILLED_SYRINGE | Freq: Once | INTRAMUSCULAR | Status: AC
  Administered 2020-01-31: 0.5 mL via INTRAMUSCULAR
  Filled 2020-01-31: qty 0.5

## 2020-01-31 MED ORDER — AMOXICILLIN-POT CLAVULANATE 875-125 MG PO TABS
1.0000 | ORAL_TABLET | Freq: Once | ORAL | Status: AC
  Administered 2020-01-31: 1 via ORAL
  Filled 2020-01-31: qty 1

## 2020-01-31 NOTE — ED Notes (Signed)
rec cat bite last pm to right index finger, a friends indoor cat that is fully vaccinated. The rt index finger is red, mild swelling noted, is normal temperature. There is not active bleeding noted.

## 2020-01-31 NOTE — ED Triage Notes (Addendum)
Pt states she was bit by a friend's cat yesterday-right index finger-cat's rabies vaccine UTD-pt NAD-steady gait

## 2020-01-31 NOTE — ED Provider Notes (Signed)
[facility] EMERGENCY DEPARTMENT Provider Note   CSN: [id number] Arrival date & time: 01/31/20  1824     History Chief Complaint  Patient presents with  . Animal Bite    Stacey Dean is a 49 y.o. female.  HPI 49 year old female presents with a cat bite to the right hand.  This occurred last night.  Is now noticing a little bit of redness to her index finger.  She was petting the cat when it bit her.  This is her friend's cat and she knows that the shots are up-to-date including rabies.  The patient is unsure of her last tetanus immunization.  Has a little bit of trouble fully bending her finger but no significant pain.   Past Medical History:  Diagnosis Date  . Anxiety   . Esophageal stricture   . Fundic gland polyposis of stomach   . GERD (gastroesophageal reflux disease)   . Hearing loss   . Thyroid disease 2015   hypothyroidism  . Wrist fracture, right    hx    Patient Active Problem List   Diagnosis Date Noted  . GERD (gastroesophageal reflux disease) 06/28/2014  . Dysphagia 06/28/2014  . Thyroid disease 02/09/2013    Past Surgical History:  Procedure Laterality Date  . CERVIX LESION DESTRUCTION    . ESOPHAGOGASTRODUODENOSCOPY (EGD) WITH ESOPHAGEAL DILATION  10/2016  . WRIST ARTHROSCOPY       OB History    Gravida  2   Para  2   Term  2   Preterm      AB      Living  2     SAB      IAB      Ectopic      Multiple      Live Births  2           Family History  Problem Relation Age of Onset  . Heart disease Father        car accident  . Breast cancer Maternal Grandmother   . Kidney cancer Maternal Grandmother   . Lung cancer Maternal Grandfather   . Stroke Paternal Grandfather   . Esophageal cancer Paternal Uncle   . Healthy Mother   . Heart disease Maternal Uncle   . Colon cancer Neg Hx   . Colon polyps Neg Hx   . Rectal cancer Neg Hx   . Stomach cancer Neg Hx     Social History   Tobacco Use  . Smoking  status: Never Smoker  . Smokeless tobacco: Never Used  Vaping Use  . Vaping Use: Never used  Substance Use Topics  . Alcohol use: No    Alcohol/week: 0.0 standard drinks  . Drug use: No    Home Medications Prior to Admission medications   Medication Sig Start Date End Date Taking? Authorizing Provider  amoxicillin-clavulanate (AUGMENTIN) 875-125 MG tablet Take 1 tablet by mouth 2 (two) times daily. One po bid x 7 days 01/31/20   Pricilla Loveless, MD  pantoprazole (PROTONIX) 40 MG tablet TAKE 1 TABLET BY MOUTH EVERY DAY BEFORE BREAKFAST 03/29/19   Iva Boop, MD  vortioxetine HBr (TRINTELLIX) 10 MG TABS Take 20 mg by mouth daily.     [provider]  zolpidem (AMBIEN) 5 MG tablet Take 5 mg by mouth at bedtime as needed for sleep.    [provider]    Allergies    Patient has no known allergies.  Review of Systems   Review  of Systems  Musculoskeletal: Positive for arthralgias.  Skin: Positive for color change.    Physical Exam Updated Vital Signs BP (!) 147/109 (BP Location: Left Arm)   Pulse 98   Temp 99 F (37.2 C) (Oral)   Resp 20   Ht 5\' 7"  (1.702 m)   Wt 88.5 kg   LMP 12/31/2019   SpO2 100%   BMI 30.54 kg/m   Physical Exam Vitals and nursing note reviewed.  Constitutional:      Appearance: She is well-developed and well-nourished.  HENT:     Head: Normocephalic and atraumatic.     Right Ear: External ear normal.     Left Ear: External ear normal.     Nose: Nose normal.  Eyes:     General:        Right eye: No discharge.        Left eye: No discharge.  Pulmonary:     Effort: Pulmonary effort is normal.  Abdominal:     General: There is no distension.  Musculoskeletal:     Comments: Right palmar index finger has small white head and mild swelling/redness over PIP. Decent ROM. No fluctuance  Skin:    General: Skin is warm and dry.     Findings: Erythema present.  Neurological:     Mental Status: She is alert.  Psychiatric:         Mood and Affect: Mood is not anxious.     ED Results / Procedures / Treatments   Labs (all labs ordered are listed, but only abnormal results are displayed) Labs Reviewed - No data to display  EKG None  Radiology No results found.  Procedures Procedures (including critical care time)  Medications Ordered in ED Medications  amoxicillin-clavulanate (AUGMENTIN) 875-125 MG per tablet 1 tablet (has no administration in time range)  Tdap (BOOSTRIX) injection 0.5 mL (has no administration in time range)    ED Course  I have reviewed the triage vital signs and the nursing notes.  Pertinent labs & imaging results that were available during my care of the patient were reviewed by me and considered in my medical decision making (see chart for details).    MDM Rules/Calculators/A&P                          Patient with some redness/early infection after a cat bite last night. Cat's rabies vaccinations are up to date. Will update Tdap. Will treat with antibiotics. Return if worsening or not improving in next 48 hours. Doubt imbedded foreign body such as tooth. Do not think xray is needed. No current evidence of tendon involvement.  Final Clinical Impression(s) / ED Diagnoses Final diagnoses:  Cat bite of index finger, initial encounter    Rx / DC Orders ED Discharge Orders         Ordered    amoxicillin-clavulanate (AUGMENTIN) 875-125 MG tablet  2 times daily        01/31/20 02/02/20           [id number], MD 01/31/20 1900

## 2020-04-14 ENCOUNTER — Other Ambulatory Visit: Payer: Self-pay | Admitting: Internal Medicine

## 2020-08-23 ENCOUNTER — Ambulatory Visit (INDEPENDENT_AMBULATORY_CARE_PROVIDER_SITE_OTHER): Payer: BC Managed Care – PPO | Admitting: Ophthalmology

## 2020-08-23 ENCOUNTER — Other Ambulatory Visit: Payer: Self-pay

## 2020-08-23 ENCOUNTER — Encounter (INDEPENDENT_AMBULATORY_CARE_PROVIDER_SITE_OTHER): Payer: Self-pay | Admitting: Ophthalmology

## 2020-08-23 DIAGNOSIS — H3589 Other specified retinal disorders: Secondary | ICD-10-CM

## 2020-08-23 DIAGNOSIS — H3581 Retinal edema: Secondary | ICD-10-CM

## 2020-08-23 DIAGNOSIS — H33301 Unspecified retinal break, right eye: Secondary | ICD-10-CM

## 2020-08-23 NOTE — Progress Notes (Signed)
[facility] Note  08/23/2020     CHIEF COMPLAINT Patient presents for Retina Evaluation   HISTORY OF PRESENT ILLNESS: Stacey Dean is a 50 y.o. female who presents to the clinic today for:   HPI     Retina Evaluation   In right eye.  This started 1 day ago.  Duration of 1 day.  I, the attending physician,  performed the HPI with the patient and updated documentation appropriately.        Comments   New patient ret eval from Dr. Posey Pronto for 2-3 ret holes OD. Pt states she went to Dr. Posey Pronto yesterday for her annual visit. Doesn't report any vision issues or changes. Some natural depletion of NV but no other issues noted.       Last edited by Bernarda Caffey, MD on 08/23/2020 11:55 AM.    Pt is here on the referral of Dr. Posey Pronto for concern of retinal holes OD, pt states she went to see her for her yearly eye exam, she is having no problems, no fol or floaters  Referring physician: Virgina Evener, [address] [place],  [place] [address]  HISTORICAL INFORMATION:   Selected notes from the MEDICAL RECORD NUMBER Referred by Dr. Truman Hayward:  Ocular Hx- PMH-    CURRENT MEDICATIONS: No current outpatient medications on file. (Ophthalmic Drugs)   No current facility-administered medications for this visit. (Ophthalmic Drugs)   Current Outpatient Medications (Other)  Medication Sig   amoxicillin-clavulanate (AUGMENTIN) 875-125 MG tablet Take 1 tablet by mouth 2 (two) times daily. One po bid x 7 days   pantoprazole (PROTONIX) 40 MG tablet TAKE 1 TABLET BY MOUTH EVERY DAY BEFORE BREAKFAST   vortioxetine HBr (TRINTELLIX) 10 MG TABS Take 20 mg by mouth daily.    zolpidem (AMBIEN) 5 MG tablet Take 5 mg by mouth at bedtime as needed for sleep.   No current facility-administered medications for this visit. (Other)      REVIEW OF SYSTEMS: ROS   Positive for: Gastrointestinal, Psychiatric Last edited by Kingsley Spittle, COT on  08/23/2020 10:25 AM.       ALLERGIES No Known Allergies  PAST MEDICAL HISTORY Past Medical History:  Diagnosis Date   Anxiety    Esophageal stricture    Fundic gland polyposis of stomach    GERD (gastroesophageal reflux disease)    Hearing loss    Thyroid disease 2015   hypothyroidism   Wrist fracture, right    hx   Past Surgical History:  Procedure Laterality Date   CERVIX LESION DESTRUCTION     ESOPHAGOGASTRODUODENOSCOPY (EGD) WITH ESOPHAGEAL DILATION  10/2016   WRIST ARTHROSCOPY      FAMILY HISTORY Family History  Problem Relation Age of Onset   Healthy Mother    Heart disease Father        car accident   Heart disease Maternal Uncle    Esophageal cancer Paternal Uncle    Macular degeneration Maternal Grandmother    Breast cancer Maternal Grandmother    Kidney cancer Maternal Grandmother    Lung cancer Maternal Grandfather    Stroke Paternal Grandfather    Colon cancer Neg Hx    Colon polyps Neg Hx    Rectal cancer Neg Hx    Stomach cancer Neg Hx     SOCIAL HISTORY Social History   Tobacco Use   Smoking status: Never   Smokeless tobacco: Never  Vaping Use   Vaping Use:  Never used  Substance Use Topics   Alcohol use: No    Alcohol/week: 0.0 standard drinks   Drug use: No         OPHTHALMIC EXAM:  Base Eye Exam     Visual Acuity (Snellen - Linear)       Right Left   Dist cc 20/20 20/20    Correction: Glasses         Tonometry (Tonopen, 10:37 AM)       Right Left   Pressure 14 15         Pupils       Dark Light Shape React APD   Right 3 2 Round Brisk None   Left 3 2 Round Brisk None         Visual Fields (Counting fingers)       Left Right    Full Full         Extraocular Movement       Right Left    Full, Ortho Full, Ortho         Neuro/Psych     Oriented x3: Yes   Mood/Affect: Normal         Dilation     Both eyes: 1.0% Mydriacyl, 2.5% Phenylephrine @ 10:42 AM           Slit Lamp and  Fundus Exam     Slit Lamp Exam       Right Left   Lids/Lashes mild Dermatochalasis - upper lid, mild MGD mild Dermatochalasis - upper lid, mild MGD   Conjunctiva/Sclera White and quiet White and quiet   Cornea Trace tear film debris Trace tear film debris, 1+ Punctate epithelial erosions   Anterior Chamber Deep and quiet Deep and quiet   Iris Round and dilated Round and dilated   Lens Clear Clear   Vitreous mild Vitreous syneresis mild Vitreous syneresis         Fundus Exam       Right Left   Disc Pink and Sharp, focal PPP Pink and Sharp, focal PPP   C/D Ratio 0.3 0.3   Macula Flat, Good foveal reflex, No heme or edema Flat, Good foveal reflex, No heme or edema   Vessels attenuated, mild tortuousity, trace AV crossing changes attenuated, mild tortuousity, trace AV crossing changes   Periphery Attached, focal pigmented retinal break at 0630, punctate retinal hole with +pigment at 0700, no heme or SRF    Attached; no RT/RD           Refraction     Wearing Rx       Sphere Cylinder Add   Right [place]  +2.00   Left +0.25 Sphere +2.00         Manifest Refraction       Sphere Cylinder Dist VA   Right [place]  20/20   Left +0.50 Sphere 20/20            IMAGING AND PROCEDURES  Imaging and Procedures for 08/23/2020  OCT, Retina - OU - Both Eyes       Right Eye Quality was good. Central Foveal Thickness: 297. Progression has no prior data. Findings include normal foveal contour, no IRF, no SRF, vitreomacular adhesion .   Left Eye Quality was good. Central Foveal Thickness: 293. Progression has no prior data. Findings include normal foveal contour, no IRF, no SRF, vitreomacular adhesion .   Notes *Images captured and stored on drive  Diagnosis / Impression:  NFP; no IRF/SRF OU  Clinical management:  See below  Abbreviations: NFP - Normal foveal profile. CME - cystoid macular edema. PED - pigment epithelial detachment. IRF - intraretinal fluid. SRF -  subretinal fluid. EZ - ellipsoid zone. ERM - epiretinal membrane. ORA - outer retinal atrophy. ORT - outer retinal tubulation. SRHM - subretinal hyper-reflective material. IRHM - intraretinal hyper-reflective material      Color Fundus Photography Optos - OU - Both Eyes       Right Eye Progression has no prior data. Disc findings include normal observations. Macula : normal observations. Vessels : tortuous vessels. Periphery : RPE abnormality, hole.   Left Eye Progression has no prior data. Disc findings include normal observations. Macula : normal observations. Vessels : tortuous vessels. Periphery : RPE abnormality.   Notes **Images stored on drive**  Impression: OD: pigmented retinal breaks inferior periphery      Repair Retinal Breaks, Laser - OD - Right Eye       LASER PROCEDURE NOTE  Procedure:  Barrier laser retinopexy using slit lamp laser, right eye   Diagnosis:   Retinal breaks, right eye                     pigmented retinal break at 630 and punctate pigmented retinal hole at 0700  Surgeon: Bernarda Caffey, MD, PhD  Anesthesia: Topical  Informed consent obtained, operative eye marked, and time out performed prior to initiation of laser.   Laser settings:  Lumenis Smart532 laser, slit lamp Lens: [place] PRP 165 Power: 260 mW Spot size: 200 microns Duration: 30 msec  # spots: 214  Placement of laser: Using a [place] PRP 165 contact lens at the slit lamp, laser was placed in three confluent rows around breaks at 0630 and 0700 anterior to equator.  Complications: None.  Patient tolerated the procedure well and received written and verbal post-procedure care information/education.              ASSESSMENT/PLAN:    ICD-10-CM   1. 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 Retinal edema  H35.81 OCT, Retina - OU - Both Eyes      1,2. Retinal  breaks, OD - The incidence, risk factors, and natural history of retinal tear was discussed with patient.   - Potential treatment options including laser retinopexy and cryotherapy discussed with patient. - punctate pigmented retinal break at 630 and punctate pigmented retinal hole at 0700 -- no SRF - recommend laser retinopexy today OD today, 07.15.22 - pt wishes to proceed with laser - RBA of procedure discussed, questions answered - informed consent obtained and signed - see procedure note - start Lotemax SM QID -- sample given - f/u in 2-3 wks   Ophthalmic Meds Ordered this visit:  No orders of the defined types were placed in this encounter.      Return for  f/u 2-3 weeks, retinal tear OD, DFE, OCT.  There are no Patient Instructions on file for this visit.   Explained the diagnoses, plan, and follow up with the patient and they expressed understanding.  Patient expressed understanding of the importance of proper follow up care.   Gardiner Sleeper, M.D., Ph.D. Diseases & Surgery of the Retina and Vitreous [facility]  I have reviewed the above documentation for accuracy and completeness, and I agree with the above. Sharyon Cable  Coralyn Pear, M.D., Ph.D. 08/23/20 12:12 PM   Abbreviations: M myopia (nearsighted); A astigmatism; H hyperopia (farsighted); P presbyopia; Mrx spectacle prescription;  CTL contact lenses; OD right eye; OS left eye; OU both eyes  XT exotropia; ET esotropia; PEK punctate epithelial keratitis; PEE punctate epithelial erosions; DES dry eye syndrome; MGD meibomian gland dysfunction; ATs artificial tears; PFAT's preservative free artificial tears; [place] nuclear sclerotic cataract; PSC posterior subcapsular cataract; ERM epi-retinal membrane; PVD posterior vitreous detachment; RD retinal detachment; DM diabetes mellitus; DR diabetic retinopathy; NPDR non-proliferative diabetic retinopathy; PDR proliferative diabetic retinopathy; CSME clinically  significant macular edema; DME diabetic macular edema; dbh dot blot hemorrhages; CWS cotton wool spot; POAG primary open angle glaucoma; C/D cup-to-disc ratio; HVF humphrey visual field; GVF goldmann visual field; OCT optical coherence tomography; IOP intraocular pressure; BRVO Branch retinal vein occlusion; CRVO central retinal vein occlusion; CRAO central retinal artery occlusion; BRAO branch retinal artery occlusion; RT retinal tear; SB scleral buckle; PPV pars plana vitrectomy; VH Vitreous hemorrhage; PRP panretinal laser photocoagulation; IVK intravitreal kenalog; VMT vitreomacular traction; MH Macular hole;  NVD neovascularization of the disc; NVE neovascularization elsewhere; AREDS age related eye disease study; ARMD age related macular degeneration; POAG primary open angle glaucoma; EBMD epithelial/anterior basement membrane dystrophy; ACIOL anterior chamber intraocular lens; IOL intraocular lens; PCIOL posterior chamber intraocular lens; Phaco/IOL phacoemulsification with intraocular lens placement; [place] photorefractive keratectomy; LASIK laser assisted in situ keratomileusis; HTN hypertension; DM diabetes mellitus; COPD chronic obstructive pulmonary disease

## 2020-09-12 NOTE — Progress Notes (Signed)
[facility] Note  09/16/2020     CHIEF COMPLAINT Patient presents for Retina Follow Up   HISTORY OF PRESENT ILLNESS: Stacey Dean is a 50 y.o. female who presents to the clinic today for:   HPI     Retina Follow Up   Patient presents with  Retinal Break/Detachment.  In right eye.  Duration of 3 weeks.  Since onset it is stable.  I, the attending physician,  performed the HPI with the patient and updated documentation appropriately.        Comments   Pt here for 3 wk ret f/u for ret break OD s/p laser 7/15. Pt states OD is stable, no issues or changes. No ocular pain or discomfort noted.       Last edited by Bernarda Caffey, MD on 09/16/2020 10:31 PM.     Pt states vision is stable, no new fol or floaters, pt used Lotemax as directed  Referring physician: Bernerd Limbo, MD [address] [place],  [place] [address]  HISTORICAL INFORMATION:   Selected notes from the MEDICAL RECORD NUMBER Referred by Dr. Truman Hayward:  Ocular Hx- PMH-    CURRENT MEDICATIONS: No current outpatient medications on file. (Ophthalmic Drugs)   No current facility-administered medications for this visit. (Ophthalmic Drugs)   Current Outpatient Medications (Other)  Medication Sig   amoxicillin-clavulanate (AUGMENTIN) 875-125 MG tablet Take 1 tablet by mouth 2 (two) times daily. One po bid x 7 days   pantoprazole (PROTONIX) 40 MG tablet TAKE 1 TABLET BY MOUTH EVERY DAY BEFORE BREAKFAST   vortioxetine HBr (TRINTELLIX) 10 MG TABS Take 20 mg by mouth daily.    zolpidem (AMBIEN) 5 MG tablet Take 5 mg by mouth at bedtime as needed for sleep.   No current facility-administered medications for this visit. (Other)    REVIEW OF SYSTEMS: ROS   Positive for: Gastrointestinal, Psychiatric Last edited by Kingsley Spittle, COT on 09/16/2020  2:56 PM.      ALLERGIES No Known Allergies  PAST MEDICAL HISTORY Past Medical History:  Diagnosis Date    Anxiety    Esophageal stricture    Fundic gland polyposis of stomach    GERD (gastroesophageal reflux disease)    Hearing loss    Thyroid disease 2015   hypothyroidism   Wrist fracture, right    hx   Past Surgical History:  Procedure Laterality Date   CERVIX LESION DESTRUCTION     ESOPHAGOGASTRODUODENOSCOPY (EGD) WITH ESOPHAGEAL DILATION  10/2016   WRIST ARTHROSCOPY      FAMILY HISTORY Family History  Problem Relation Age of Onset   Healthy Mother    Heart disease Father        car accident   Heart disease Maternal Uncle    Esophageal cancer Paternal Uncle    Macular degeneration Maternal Grandmother    Breast cancer Maternal Grandmother    Kidney cancer Maternal Grandmother    Lung cancer Maternal Grandfather    Stroke Paternal Grandfather    Colon cancer Neg Hx    Colon polyps Neg Hx    Rectal cancer Neg Hx    Stomach cancer Neg Hx     SOCIAL HISTORY Social History   Tobacco Use   Smoking status: Never   Smokeless tobacco: Never  Vaping Use   Vaping Use: Never used  Substance Use Topics   Alcohol use: No    Alcohol/week: 0.0 standard drinks   Drug use: No  OPHTHALMIC EXAM:  Base Eye Exam     Visual Acuity (Snellen - Linear)       Right Left   Dist cc 20/20 20/20    Correction: Glasses         Tonometry (Tonopen, 3:02 PM)       Right Left   Pressure 21 19         Pupils       Dark Light Shape React APD   Right 3 2 Round Brisk None   Left 3  2 Round  Brisk None         Visual Fields (Counting fingers)       Left Right    Full Full         Extraocular Movement       Right Left    Full, Ortho Full, Ortho         Neuro/Psych     Oriented x3: Yes   Mood/Affect: Normal         Dilation     Right eye: 1.0% Mydriacyl, 2.5% Phenylephrine @ 3:05 PM  Dilated OD only, pt driving MS          Slit Lamp and Fundus Exam     Slit Lamp Exam       Right Left   Lids/Lashes mild Dermatochalasis - upper lid,  mild MGD mild Dermatochalasis - upper lid, mild MGD   Conjunctiva/Sclera White and quiet White and quiet   Cornea Trace tear film debris Trace tear film debris, 1+ Punctate epithelial erosions   Anterior Chamber Deep and quiet Deep and quiet   Iris Round and dilated Round and reactive   Lens Clear Clear   Vitreous mild Vitreous syneresis mild Vitreous syneresis         Fundus Exam       Right Left   Disc Pink and Sharp, focal PPP Pink and Sharp, focal PPP   C/D Ratio 0.3 0.3   Macula Flat, Good foveal reflex, No heme or edema Flat, Good foveal reflex, No heme or edema   Vessels attenuated, mild tortuousity, trace AV crossing changes attenuated, mild tortuousity, trace AV crossing changes   Periphery Attached, focal pigmented retinal break at 0630, punctate retinal hole with +pigment at 0700 -- good laser surrounding both lesions, no heme or SRF    Attached; no RT/RD           Refraction     Wearing Rx       Sphere Cylinder Add   Right [place]  +2.00   Left +0.25 Sphere +2.00            IMAGING AND PROCEDURES  Imaging and Procedures for 09/16/2020  OCT, Retina - OU - Both Eyes       Right Eye Quality was good. Central Foveal Thickness: 309. Progression has been stable. Findings include normal foveal contour, no IRF, no SRF, vitreomacular adhesion .   Left Eye Quality was good. Central Foveal Thickness: 301. Progression has been stable. Findings include normal foveal contour, no IRF, no SRF, vitreomacular adhesion .   Notes *Images captured and stored on drive  Diagnosis / Impression:  NFP; no IRF/SRF OU   Clinical management:  See below  Abbreviations: NFP - Normal foveal profile. CME - cystoid macular edema. PED - pigment epithelial detachment. IRF - intraretinal fluid. SRF - subretinal fluid. EZ - ellipsoid zone. ERM - epiretinal membrane. ORA - outer retinal atrophy. ORT - outer retinal tubulation. SRHM -  subretinal hyper-reflective material. IRHM -  intraretinal hyper-reflective material            ASSESSMENT/PLAN:    ICD-10-CM   1. 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 Retinal edema  H35.81 OCT, Retina - OU - Both Eyes      1,2. Retinal breaks, OD - punctate pigmented retinal break at 630 and punctate pigmented retinal hole at 0700 -- no SRF - s/p laser retinopexy OD (07.15.22) -- good laser surrounding both breaks - completed Lotemax SM QID - f/u in 3 months, DFE, OCT   Ophthalmic Meds Ordered this visit:  No orders of the defined types were placed in this encounter.      Return in about 3 months (around 12/17/2020) for f/u retinal break OD, DFE, OCT.  There are no Patient Instructions on file for this visit.  This document serves as a record of services personally performed by Gardiner Sleeper, MD, PhD. It was created on their behalf by Leeann Must, [place], an ophthalmic technician. The creation of this record is the provider's dictation and/or activities during the visit.    Electronically signed by: Leeann Must, COA _0 @ 10:33 PM  This document serves as a record of services personally performed by Gardiner Sleeper, MD, PhD. It was created on their behalf by San Jetty. Owens Shark, OA an ophthalmic technician. The creation of this record is the provider's dictation and/or activities during the visit.    Electronically signed by: San Jetty. Marguerita Merles 08.08.2022 10:33 PM  Gardiner Sleeper, M.D., Ph.D. Diseases & Surgery of the Retina and [place] 09/16/2020   I have reviewed the above documentation for accuracy and completeness, and I agree with the above. Gardiner Sleeper, M.D., Ph.D. 09/16/20 10:33 PM  Abbreviations: M myopia (nearsighted); A astigmatism; H hyperopia (farsighted); P presbyopia; Mrx spectacle prescription;  CTL contact lenses; OD right eye; OS left eye; OU both eyes  XT exotropia; ET esotropia; PEK punctate epithelial keratitis; PEE punctate epithelial  erosions; DES dry eye syndrome; MGD meibomian gland dysfunction; ATs artificial tears; PFAT's preservative free artificial tears; [place] nuclear sclerotic cataract; PSC posterior subcapsular cataract; ERM epi-retinal membrane; PVD posterior vitreous detachment; RD retinal detachment; DM diabetes mellitus; DR diabetic retinopathy; NPDR non-proliferative diabetic retinopathy; PDR proliferative diabetic retinopathy; CSME clinically significant macular edema; DME diabetic macular edema; dbh dot blot hemorrhages; CWS cotton wool spot; POAG primary open angle glaucoma; C/D cup-to-disc ratio; HVF humphrey visual field; GVF goldmann visual field; OCT optical coherence tomography; IOP intraocular pressure; BRVO Branch retinal vein occlusion; CRVO central retinal vein occlusion; CRAO central retinal artery occlusion; BRAO branch retinal artery occlusion; RT retinal tear; SB scleral buckle; PPV pars plana vitrectomy; VH Vitreous hemorrhage; PRP panretinal laser photocoagulation; IVK intravitreal kenalog; VMT vitreomacular traction; MH Macular hole;  NVD neovascularization of the disc; NVE neovascularization elsewhere; AREDS age related eye disease study; ARMD age related macular degeneration; POAG primary open angle glaucoma; EBMD epithelial/anterior basement membrane dystrophy; ACIOL anterior chamber intraocular lens; IOL intraocular lens; PCIOL posterior chamber intraocular lens; Phaco/IOL phacoemulsification with intraocular lens placement; [place] photorefractive keratectomy; LASIK laser assisted in situ keratomileusis; HTN hypertension; DM diabetes mellitus; COPD chronic obstructive pulmonary disease

## 2020-09-16 ENCOUNTER — Other Ambulatory Visit: Payer: Self-pay

## 2020-09-16 ENCOUNTER — Encounter (INDEPENDENT_AMBULATORY_CARE_PROVIDER_SITE_OTHER): Payer: Self-pay | Admitting: Ophthalmology

## 2020-09-16 ENCOUNTER — Ambulatory Visit (INDEPENDENT_AMBULATORY_CARE_PROVIDER_SITE_OTHER): Payer: BC Managed Care – PPO | Admitting: Ophthalmology

## 2020-09-16 DIAGNOSIS — H3581 Retinal edema: Secondary | ICD-10-CM

## 2020-09-16 DIAGNOSIS — H3589 Other specified retinal disorders: Secondary | ICD-10-CM | POA: Diagnosis not present

## 2020-12-13 NOTE — Progress Notes (Addendum)
[facility] - Clinic Note  12/17/2020     CHIEF COMPLAINT Patient presents for Retina Follow Up   HISTORY OF PRESENT ILLNESS: Stacey Dean is a 50 y.o. female who presents to the clinic today for:   HPI     Retina Follow Up   Patient presents with  Retinal Break/Detachment.  In right eye.  This started 3 months ago.  I, the attending physician,  performed the HPI with the patient and updated documentation appropriately.        Comments   Patient here for 3 months retina follow up for retina break OD. Patient states vision doing fine. No eye pain.       Last edited by Rennis Chris, MD on 12/18/2020 10:38 PM.     Referring physician: Nelson Chimes, MD [address] [place],  [place] [address]  HISTORICAL INFORMATION:   Selected notes from the MEDICAL RECORD NUMBER    CURRENT MEDICATIONS: No current outpatient medications on file. (Ophthalmic Drugs)   No current facility-administered medications for this visit. (Ophthalmic Drugs)   Current Outpatient Medications (Other)  Medication Sig   amoxicillin-clavulanate (AUGMENTIN) 875-125 MG tablet Take 1 tablet by mouth 2 (two) times daily. One po bid x 7 days   pantoprazole (PROTONIX) 40 MG tablet TAKE 1 TABLET BY MOUTH EVERY DAY BEFORE BREAKFAST   vortioxetine HBr (TRINTELLIX) 10 MG TABS Take 20 mg by mouth daily.    zolpidem (AMBIEN) 5 MG tablet Take 5 mg by mouth at bedtime as needed for sleep.   No current facility-administered medications for this visit. (Other)    REVIEW OF SYSTEMS: ROS   Positive for: Gastrointestinal, Psychiatric Last edited by Laddie Aquas, COA on 12/17/2020  3:14 PM.       ALLERGIES No Known Allergies  PAST MEDICAL HISTORY Past Medical History:  Diagnosis Date   Anxiety    Esophageal stricture    Fundic gland polyposis of stomach    GERD (gastroesophageal reflux disease)    Hearing loss    Thyroid disease 2015   hypothyroidism   Wrist  fracture, right    hx   Past Surgical History:  Procedure Laterality Date   CERVIX LESION DESTRUCTION     ESOPHAGOGASTRODUODENOSCOPY (EGD) WITH ESOPHAGEAL DILATION  10/2016   WRIST ARTHROSCOPY      FAMILY HISTORY Family History  Problem Relation Age of Onset   Healthy Mother    Heart disease Father        car accident   Heart disease Maternal Uncle    Esophageal cancer Paternal Uncle    Macular degeneration Maternal Grandmother    Breast cancer Maternal Grandmother    Kidney cancer Maternal Grandmother    Lung cancer Maternal Grandfather    Stroke Paternal Grandfather    Colon cancer Neg Hx    Colon polyps Neg Hx    Rectal cancer Neg Hx    Stomach cancer Neg Hx     SOCIAL HISTORY Social History   Tobacco Use   Smoking status: Never   Smokeless tobacco: Never  Vaping Use   Vaping Use: Never used  Substance Use Topics   Alcohol use: No    Alcohol/week: 0.0 standard drinks   Drug use: No         OPHTHALMIC EXAM:  Base Eye Exam     Visual Acuity (Snellen - Linear)       Right Left   Dist cc 20/20 20/20  Correction: Glasses         Tonometry (Tonopen, 3:12 PM)       Right Left   Pressure 14 12         Pupils       Dark Light Shape React APD   Right 3 2 Round Brisk None   Left 3 2 Round Brisk None         Visual Fields (Counting fingers)       Left Right    Full Full         Extraocular Movement       Right Left    Full, Ortho Full, Ortho         Neuro/Psych     Oriented x3: Yes   Mood/Affect: Normal         Dilation     Both eyes: 1.0% Mydriacyl, 2.5% Phenylephrine @ 3:12 PM           Slit Lamp and Fundus Exam     Slit Lamp Exam       Right Left   Lids/Lashes mild Dermatochalasis - upper lid, mild MGD mild Dermatochalasis - upper lid, mild MGD   Conjunctiva/Sclera White and quiet White and quiet   Cornea Trace tear film debris Trace tear film debris, 1+ Punctate epithelial erosions   Anterior Chamber  Deep and quiet Deep and quiet   Iris Round and dilated Round and reactive   Lens Clear Clear   Vitreous mild Vitreous syneresis mild Vitreous syneresis         Fundus Exam       Right Left   Disc Pink and Sharp, focal PPP Pink and Sharp, focal PPP   C/D Ratio 0.3 0.3   Macula Flat, Good foveal reflex, No heme or edema Flat, Good foveal reflex, No heme or edema   Vessels attenuated, mild tortuousity, trace AV crossing changes attenuated, mild tortuousity, trace AV crossing changes   Periphery Attached, focal pigmented retinal break at 0630, punctate retinal hole with +pigment at 0700 -- good laser surrounding both lesions, no heme or SRF, no new RT/RD    Attached; no RT/RD           Refraction     Wearing Rx       Sphere Cylinder Add   Right [place]  +2.00   Left +0.25 Sphere +2.00            IMAGING AND PROCEDURES  Imaging and Procedures for 12/17/2020  OCT, Retina - OU - Both Eyes       Right Eye Quality was good. Central Foveal Thickness: 299. Progression has been stable. Findings include normal foveal contour, no IRF, no SRF, vitreomacular adhesion .   Left Eye Quality was good. Central Foveal Thickness: 296. Progression has been stable. Findings include normal foveal contour, no IRF, no SRF, vitreomacular adhesion .   Notes *Images captured and stored on drive  Diagnosis / Impression:  NFP; no IRF/SRF OU  Clinical management:  See below  Abbreviations: NFP - Normal foveal profile. CME - cystoid macular edema. PED - pigment epithelial detachment. IRF - intraretinal fluid. SRF - subretinal fluid. EZ - ellipsoid zone. ERM - epiretinal membrane. ORA - outer retinal atrophy. ORT - outer retinal tubulation. SRHM - subretinal hyper-reflective material. IRHM - intraretinal hyper-reflective material             ASSESSMENT/PLAN:    ICD-10-CM   1. 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 Retinal edema  H35.81 OCT, Retina - OU - Both Eyes        1,2. Retinal breaks, OD - punctate pigmented retinal break at 630 and punctate pigmented retinal hole at 0700 -- no SRF - s/p laser retinopexy OD (07.15.22) -- good laser surrounding both breaks - completed Lotemax SM QID - no new RT/RD OU - pt is cleared from a retina standpoint for release to [facility] and resumption of primary eye care (originally referred by Dr. Allena Katz) - f/u here prn   Ophthalmic Meds Ordered this visit:  No orders of the defined types were placed in this encounter.      Return if symptoms worsen or fail to improve.  There are no Patient Instructions on file for this visit.  This document serves as a record of services personally performed by Karie Chimera, MD, PhD. It was created on their behalf by Cristopher Estimable, COT an ophthalmic technician. The creation of this record is the provider's dictation and/or activities during the visit.    Electronically signed by: Cristopher Estimable, COT 11.4.22 @ 10:40 PM   This document serves as a record of services personally performed by Karie Chimera, MD, PhD. It was created on their behalf by Joni Reining, an ophthalmic technician. The creation of this record is the provider's dictation and/or activities during the visit.    Electronically signed by: Joni Reining COA, 12/18/20  10:40 PM   Karie Chimera, M.D., Ph.D. Diseases & Surgery of the Retina and Vitreous [facility]  I have reviewed the above documentation for accuracy and completeness, and I agree with the above. Karie Chimera, M.D., Ph.D. 12/18/20 10:40 PM  Abbreviations: M myopia (nearsighted); A astigmatism; H hyperopia (farsighted); P presbyopia; Mrx spectacle prescription;  CTL contact lenses; OD right eye; OS left eye; OU both eyes  XT exotropia; ET esotropia; PEK punctate epithelial keratitis; PEE punctate epithelial erosions; DES dry eye syndrome; MGD meibomian gland dysfunction; ATs artificial tears; PFAT's  preservative free artificial tears; NSC nuclear sclerotic cataract; PSC posterior subcapsular cataract; ERM epi-retinal membrane; PVD posterior vitreous detachment; RD retinal detachment; DM diabetes mellitus; DR diabetic retinopathy; NPDR non-proliferative diabetic retinopathy; PDR proliferative diabetic retinopathy; CSME clinically significant macular edema; DME diabetic macular edema; dbh dot blot hemorrhages; CWS cotton wool spot; POAG primary open angle glaucoma; C/D cup-to-disc ratio; HVF humphrey visual field; GVF goldmann visual field; OCT optical coherence tomography; IOP intraocular pressure; BRVO Branch retinal vein occlusion; CRVO central retinal vein occlusion; CRAO central retinal artery occlusion; BRAO branch retinal artery occlusion; RT retinal tear; SB scleral buckle; PPV pars plana vitrectomy; VH Vitreous hemorrhage; PRP panretinal laser photocoagulation; IVK intravitreal kenalog; VMT vitreomacular traction; MH Macular hole;  NVD neovascularization of the disc; NVE neovascularization elsewhere; AREDS age related eye disease study; ARMD age related macular degeneration; POAG primary open angle glaucoma; EBMD epithelial/anterior basement membrane dystrophy; ACIOL anterior chamber intraocular lens; IOL intraocular lens; PCIOL posterior chamber intraocular lens; Phaco/IOL phacoemulsification with intraocular lens placement; PRK photorefractive keratectomy; LASIK laser assisted in situ keratomileusis; HTN hypertension; DM diabetes mellitus; COPD chronic obstructive pulmonary disease

## 2020-12-17 ENCOUNTER — Other Ambulatory Visit: Payer: Self-pay

## 2020-12-17 ENCOUNTER — Ambulatory Visit (INDEPENDENT_AMBULATORY_CARE_PROVIDER_SITE_OTHER): Payer: BC Managed Care – PPO | Admitting: Ophthalmology

## 2020-12-17 ENCOUNTER — Encounter (INDEPENDENT_AMBULATORY_CARE_PROVIDER_SITE_OTHER): Payer: Self-pay | Admitting: Ophthalmology

## 2020-12-17 DIAGNOSIS — H3589 Other specified retinal disorders: Secondary | ICD-10-CM | POA: Diagnosis not present

## 2020-12-17 DIAGNOSIS — H3581 Retinal edema: Secondary | ICD-10-CM

## 2020-12-18 ENCOUNTER — Encounter (INDEPENDENT_AMBULATORY_CARE_PROVIDER_SITE_OTHER): Payer: Self-pay | Admitting: Ophthalmology

## 2021-03-03 ENCOUNTER — Other Ambulatory Visit: Payer: Self-pay | Admitting: Internal Medicine

## 2021-05-26 ENCOUNTER — Other Ambulatory Visit: Payer: Self-pay | Admitting: Internal Medicine

## 2021-08-07 ENCOUNTER — Other Ambulatory Visit: Payer: Self-pay | Admitting: Internal Medicine

## 2021-08-11 ENCOUNTER — Other Ambulatory Visit: Payer: Self-pay | Admitting: Internal Medicine

## 2021-08-26 ENCOUNTER — Other Ambulatory Visit: Payer: Self-pay | Admitting: Internal Medicine

## 2021-11-21 ENCOUNTER — Other Ambulatory Visit: Payer: Self-pay | Admitting: Internal Medicine

## 2022-01-16 ENCOUNTER — Encounter: Payer: Self-pay | Admitting: Adult Health Nurse Practitioner

## 2023-11-01 ENCOUNTER — Inpatient Hospital Stay

## 2023-11-01 ENCOUNTER — Inpatient Hospital Stay: Attending: Oncology | Admitting: Oncology

## 2023-11-01 ENCOUNTER — Encounter: Payer: Self-pay | Admitting: Oncology

## 2023-11-01 VITALS — BP 147/100 | HR 100 | Temp 98.3°F | Resp 20 | Wt 168.0 lb

## 2023-11-01 DIAGNOSIS — D751 Secondary polycythemia: Secondary | ICD-10-CM

## 2023-11-01 LAB — CBC (CANCER CENTER ONLY)
HCT: 44.4 % (ref 36.0–46.0)
Hemoglobin: 14.9 g/dL (ref 12.0–15.0)
MCH: 31.4 pg (ref 26.0–34.0)
MCHC: 33.6 g/dL (ref 30.0–36.0)
MCV: 93.5 fL (ref 80.0–100.0)
Platelet Count: 263 K/uL (ref 150–400)
RBC: 4.75 MIL/uL (ref 3.87–5.11)
RDW: 12.4 % (ref 11.5–15.5)
WBC Count: 6.5 K/uL (ref 4.0–10.5)
nRBC: 0 % (ref 0.0–0.2)

## 2023-11-01 LAB — IRON AND TIBC
Iron: 151 ug/dL (ref 28–170)
Saturation Ratios: 38 % — ABNORMAL HIGH (ref 10.4–31.8)
TIBC: 395 ug/dL (ref 250–450)
UIBC: 244 ug/dL

## 2023-11-01 LAB — FERRITIN: Ferritin: 45 ng/mL (ref 11–307)

## 2023-11-01 NOTE — Progress Notes (Signed)
 [facility]  Telephone:(336) [phone] Fax:(336) [phone]  ID: Stacey Dean OB: Jan 19, 1971  MR#: [id number]  RDW#:[id number]  Patient Care Team: Pura Lenis, MD as PCP - General (Family Medicine)  CHIEF COMPLAINT: Polycythemia.  INTERVAL HISTORY: Patient is a 53 year old female who was noted to have a mildly elevated hemoglobin on routine blood work.  She currently feels well and is asymptomatic.  She has no neurologic complaints.  She denies any recent fevers or illnesses.  She has a good appetite and denies weight loss.  She has no chest pain, shortness of breath, cough, or hemoptysis.  She denies any nausea, vomiting, constipation, or diarrhea.  She has no urinary complaints.  Patient offers no specific complaints today.  REVIEW OF SYSTEMS:   Review of Systems  Constitutional: Negative.  Negative for fever, malaise/fatigue and weight loss.  Respiratory: Negative.  Negative for cough, hemoptysis and shortness of breath.   Cardiovascular: Negative.  Negative for chest pain and leg swelling.  Gastrointestinal: Negative.  Negative for abdominal pain.  Genitourinary: Negative.  Negative for dysuria.  Musculoskeletal: Negative.  Negative for back pain.  Skin: Negative.  Negative for rash.  Neurological: Negative.  Negative for dizziness, focal weakness, weakness and headaches.  Psychiatric/Behavioral: Negative.  The patient is not nervous/anxious.     As per HPI. Otherwise, a complete review of systems is negative.  PAST MEDICAL HISTORY: Past Medical History:  Diagnosis Date   Anxiety    Esophageal stricture    Fundic gland polyposis of stomach    GERD (gastroesophageal reflux disease)    Hearing loss    Thyroid  disease 2015   hypothyroidism   Wrist fracture, right    hx    PAST SURGICAL HISTORY: Past Surgical History:  Procedure Laterality Date   CERVIX LESION DESTRUCTION     ESOPHAGOGASTRODUODENOSCOPY (EGD) WITH ESOPHAGEAL DILATION  10/2016    WRIST ARTHROSCOPY      FAMILY HISTORY: Family History  Problem Relation Age of Onset   Healthy Mother    Heart disease Father        car accident   Heart disease Maternal Uncle    Esophageal cancer Paternal Uncle    Macular degeneration Maternal Grandmother    Breast cancer Maternal Grandmother    Kidney cancer Maternal Grandmother    Lung cancer Maternal Grandfather    Stroke Paternal Grandfather    Colon cancer Neg Hx    Colon polyps Neg Hx    Rectal cancer Neg Hx    Stomach cancer Neg Hx     ADVANCED DIRECTIVES (Y/N):  N  HEALTH MAINTENANCE: Social History   Tobacco Use   Smoking status: Never   Smokeless tobacco: Never  Vaping Use   Vaping status: Never Used  Substance Use Topics   Alcohol use: No    Alcohol/week: 0.0 standard drinks of alcohol   Drug use: No     Colonoscopy:  PAP:  Bone density:  Lipid panel:  No Known Allergies  Current Outpatient Medications  Medication Sig Dispense Refill   pantoprazole  (PROTONIX ) 40 MG tablet TAKE 1 TABLET BY MOUTH EVERY DAY BEFORE BREAKFAST 90 tablet 0   progesterone (PROMETRIUM) 200 MG capsule Take 250 mg by mouth.     Testosterone Micronized POWD Apply 1 Application topically.     vortioxetine HBr (TRINTELLIX) 10 MG TABS Take 20 mg by mouth daily.      ZEPBOUND 7.5 MG/0.5ML Pen Inject 7.5 mg into the skin.     zolpidem  (AMBIEN ) 5  MG tablet Take 5 mg by mouth at bedtime as needed for sleep.     No current facility-administered medications for this visit.    OBJECTIVE: Vitals:   11/01/23 1125  BP: (!) 147/100  Pulse: 100  Resp: 20  Temp: 98.3 F (36.8 C)  SpO2: 100%     Body mass index is 26.31 kg/m.    ECOG FS:0 - Asymptomatic  General: Well-developed, well-nourished, no acute distress. Eyes: Pink conjunctiva, anicteric sclera. HEENT: Normocephalic, moist mucous membranes. Lungs: No audible wheezing or coughing. Heart: Regular rate and rhythm. Abdomen: Soft, nontender, no obvious  distention. Musculoskeletal: No edema, cyanosis, or clubbing. Neuro: Alert, answering all questions appropriately. Cranial nerves grossly intact. Skin: No rashes or petechiae noted. Psych: Normal affect. Lymphatics: No cervical, calvicular, axillary or inguinal LAD.   LAB RESULTS:  Lab Results  Component Value Date   NA 135 10/16/2010   K 4.4 10/16/2010   CL 102 10/16/2010   CO2 21 10/16/2010   GLUCOSE 72 10/16/2010   BUN 11 10/16/2010   CREATININE 0.68 10/16/2010   CALCIUM 10.2 10/16/2010   PROT 6.5 10/16/2010   ALBUMIN 3.0 (L) 10/16/2010   AST 28 10/16/2010   ALT 11 10/16/2010   ALKPHOS 182 (H) 10/16/2010   BILITOT 0.3 10/16/2010   GFRNONAA >60 10/16/2010   GFRAA >60 10/16/2010    Lab Results  Component Value Date   WBC 6.5 11/01/2023   HGB 14.9 11/01/2023   HCT 44.4 11/01/2023   MCV 93.5 11/01/2023   PLT 263 11/01/2023     STUDIES: No results found.  ASSESSMENT: Polycythemia.  PLAN:    Polycythemia: Repeat hemoglobin today is within normal limits at 14.9.  All of her laboratory work including iron stores, erythropoietin  level, hemochromatosis mutation, and JAK2 mutation with reflex were all drawn for completeness and are pending at time of dictation.  Patient is a non-smoker, so a carbon monoxide level was unnecessary.  No intervention is needed at this time.  Patient does not require treatment or bone marrow biopsy.  She will have video-assisted telemedicine visit in 3 weeks to discuss the results.  I spent a total of 45 minutes reviewing chart data, face-to-face evaluation with the patient, counseling and coordination of care as detailed above.  Patient expressed understanding and was in agreement with this plan. She also understands that She can call clinic at any time with any questions, concerns, or complaints.    Stacey JINNY Reusing, MD   11/01/2023 12:18 PM

## 2023-11-03 LAB — ERYTHROPOIETIN: Erythropoietin: 7.7 m[IU]/mL (ref 2.6–18.5)

## 2023-11-04 LAB — HEMOCHROMATOSIS DNA-PCR(C282Y,H63D)

## 2023-11-09 LAB — JAK2 V617F RFX CALR/MPL/E12-15

## 2023-11-09 LAB — CALR +MPL + E12-E15  (REFLEX)

## 2023-11-24 ENCOUNTER — Inpatient Hospital Stay: Attending: Oncology | Admitting: Oncology

## 2023-11-24 ENCOUNTER — Encounter: Payer: Self-pay | Admitting: Oncology

## 2023-11-24 DIAGNOSIS — D751 Secondary polycythemia: Secondary | ICD-10-CM

## 2023-11-24 NOTE — Progress Notes (Unsigned)
 Patient states that she is doing ok, no new symptoms or questions for the provider today.

## 2023-11-24 NOTE — Progress Notes (Unsigned)
 [facility]  Telephone:(336) [phone] Fax:(336) [phone]  ID: Stacey Dean OB: 27-Feb-1970  MR#: [id number]  RDW#:[id number]  Patient Care Team: Pura Lenis, MD as PCP - General (Family Medicine)  I connected with Stacey Dean on 11/25/23 at  2:30 PM EDT by video enabled telemedicine visit and verified that I am speaking with the correct person using two identifiers.   I discussed the limitations, risks, security and privacy concerns of performing an evaluation and management service by telemedicine and the availability of in-person appointments. I also discussed with the patient that there may be a patient responsible charge related to this service. The patient expressed understanding and agreed to proceed.   Other persons participating in the visit and their role in the encounter: Patient, MD.  Patient's location: Home. Provider's location: Clinic.  CHIEF COMPLAINT: Polycythemia.  INTERVAL HISTORY: Patient agreed to video-assisted telemedicine visit for further evaluation and discussion of her laboratory results.  She continues to feel well and remains asymptomatic.  She has no neurologic complaints.  She denies any recent fevers or illnesses.  She has a good appetite and denies weight loss.  She has no chest pain, shortness of breath, cough, or hemoptysis.  She denies any nausea, vomiting, constipation, or diarrhea.  She has no urinary complaints.  Patient offers no specific complaints today.  REVIEW OF SYSTEMS:   Review of Systems  Constitutional: Negative.  Negative for fever, malaise/fatigue and weight loss.  Respiratory: Negative.  Negative for cough, hemoptysis and shortness of breath.   Cardiovascular: Negative.  Negative for chest pain and leg swelling.  Gastrointestinal: Negative.  Negative for abdominal pain.  Genitourinary: Negative.  Negative for dysuria.  Musculoskeletal: Negative.  Negative for back pain.  Skin: Negative.  Negative for  rash.  Neurological: Negative.  Negative for dizziness, focal weakness, weakness and headaches.  Psychiatric/Behavioral: Negative.  The patient is not nervous/anxious.     As per HPI. Otherwise, a complete review of systems is negative.  PAST MEDICAL HISTORY: Past Medical History:  Diagnosis Date   Anxiety    Esophageal stricture    Fundic gland polyposis of stomach    GERD (gastroesophageal reflux disease)    Hearing loss    Thyroid  disease 2015   hypothyroidism   Wrist fracture, right    hx    PAST SURGICAL HISTORY: Past Surgical History:  Procedure Laterality Date   CERVIX LESION DESTRUCTION     ESOPHAGOGASTRODUODENOSCOPY (EGD) WITH ESOPHAGEAL DILATION  10/2016   WRIST ARTHROSCOPY      FAMILY HISTORY: Family History  Problem Relation Age of Onset   Healthy Mother    Heart disease Father        car accident   Heart disease Maternal Uncle    Esophageal cancer Paternal Uncle    Macular degeneration Maternal Grandmother    Breast cancer Maternal Grandmother    Kidney cancer Maternal Grandmother    Lung cancer Maternal Grandfather    Stroke Paternal Grandfather    Colon cancer Neg Hx    Colon polyps Neg Hx    Rectal cancer Neg Hx    Stomach cancer Neg Hx     ADVANCED DIRECTIVES (Y/N):  N  HEALTH MAINTENANCE: Social History   Tobacco Use   Smoking status: Never   Smokeless tobacco: Never  Vaping Use   Vaping status: Never Used  Substance Use Topics   Alcohol use: No    Alcohol/week: 0.0 standard drinks of alcohol   Drug use: No  Colonoscopy:  PAP:  Bone density:  Lipid panel:  No Known Allergies  Current Outpatient Medications  Medication Sig Dispense Refill   pantoprazole  (PROTONIX ) 40 MG tablet TAKE 1 TABLET BY MOUTH EVERY DAY BEFORE BREAKFAST 90 tablet 0   progesterone (PROMETRIUM) 200 MG capsule Take 250 mg by mouth.     Testosterone Micronized POWD Apply 1 Application topically.     vortioxetine HBr (TRINTELLIX) 10 MG TABS Take 20 mg by  mouth daily.      ZEPBOUND 7.5 MG/0.5ML Pen Inject 7.5 mg into the skin.     zolpidem  (AMBIEN ) 5 MG tablet Take 5 mg by mouth at bedtime as needed for sleep.     No current facility-administered medications for this visit.    OBJECTIVE: There were no vitals filed for this visit.    There is no height or weight on file to calculate BMI.    ECOG FS:0 - Asymptomatic  General: Well-developed, well-nourished, no acute distress. HEENT: Normocephalic. Neuro: Alert, answering all questions appropriately. Cranial nerves grossly intact. Psych: Normal affect.  LAB RESULTS:  Lab Results  Component Value Date   NA 135 10/16/2010   K 4.4 10/16/2010   CL 102 10/16/2010   CO2 21 10/16/2010   GLUCOSE 72 10/16/2010   BUN 11 10/16/2010   CREATININE 0.68 10/16/2010   CALCIUM 10.2 10/16/2010   PROT 6.5 10/16/2010   ALBUMIN 3.0 (L) 10/16/2010   AST 28 10/16/2010   ALT 11 10/16/2010   ALKPHOS 182 (H) 10/16/2010   BILITOT 0.3 10/16/2010   GFRNONAA >60 10/16/2010   GFRAA >60 10/16/2010    Lab Results  Component Value Date   WBC 6.5 11/01/2023   HGB 14.9 11/01/2023   HCT 44.4 11/01/2023   MCV 93.5 11/01/2023   PLT 263 11/01/2023     STUDIES: No results found.  ASSESSMENT: Polycythemia.  PLAN:    Polycythemia: Repeat hemoglobin today is within normal limits at 14.9.  All of her laboratory work including iron stores, erythropoietin  level, hemochromatosis mutation, and JAK2 mutation with reflex are either negative or within normal limits.  Patient is a non-smoker, so a carbon monoxide level was unnecessary.  No intervention is needed at this time.  Patient does not require bone marrow biopsy.  No further follow-up has been scheduled.  Please continue to monitor CBC routinely and if hemoglobin trends above 18.0 refer patient back for further evaluation.  Hormonal treatments: Continue as per primary care.    I provided 20 minutes of face-to-face video visit time during this encounter which  included chart review, counseling, and coordination of care as documented above.   Patient expressed understanding and was in agreement with this plan. She also understands that She can call clinic at any time with any questions, concerns, or complaints.    Evalene JINNY Reusing, MD   11/24/2023 2:36 PM

## 2023-12-13 ENCOUNTER — Encounter: Payer: Self-pay | Admitting: Radiology

## 2023-12-15 ENCOUNTER — Ambulatory Visit: Admitting: Orthopaedic Surgery
